# Patient Record
Sex: Male | Born: 1945 | Race: White | Hispanic: No | Marital: Married | Smoking: Never smoker
Health system: Southern US, Community
[De-identification: ages and names within clinical notes are randomized; demographics above are authoritative.]

## PROBLEM LIST (undated history)

## (undated) DIAGNOSIS — I10 Essential (primary) hypertension: Secondary | ICD-10-CM

## (undated) DIAGNOSIS — I420 Dilated cardiomyopathy: Secondary | ICD-10-CM

## (undated) HISTORY — DX: Essential (primary) hypertension: I10

## (undated) HISTORY — DX: Dilated cardiomyopathy: I42.0

---

## 2007-11-17 ENCOUNTER — Ambulatory Visit: Payer: Self-pay | Admitting: Family Medicine

## 2007-12-10 ENCOUNTER — Ambulatory Visit: Payer: Self-pay | Admitting: Otolaryngology

## 2009-10-19 ENCOUNTER — Emergency Department: Payer: Self-pay | Admitting: Emergency Medicine

## 2009-11-21 ENCOUNTER — Encounter: Payer: Self-pay | Admitting: Cardiovascular Disease

## 2009-11-21 DIAGNOSIS — R0609 Other forms of dyspnea: Secondary | ICD-10-CM | POA: Insufficient documentation

## 2009-11-21 DIAGNOSIS — R0989 Other specified symptoms and signs involving the circulatory and respiratory systems: Secondary | ICD-10-CM

## 2009-11-21 DIAGNOSIS — I10 Essential (primary) hypertension: Secondary | ICD-10-CM

## 2009-11-28 ENCOUNTER — Encounter: Payer: Self-pay | Admitting: Cardiology

## 2009-11-28 ENCOUNTER — Ambulatory Visit: Payer: Self-pay

## 2010-04-05 ENCOUNTER — Ambulatory Visit: Payer: Self-pay | Admitting: Otolaryngology

## 2010-04-09 LAB — PATHOLOGY REPORT

## 2010-09-18 NOTE — Miscellaneous (Signed)
Summary: Orders Update/Dr Ronna Polio ordered  Clinical Lists Changes  Orders: Added new Referral order of Echocardiogram (Echo) - Signed

## 2011-03-05 ENCOUNTER — Encounter: Payer: Self-pay | Admitting: Cardiovascular Disease

## 2011-05-09 ENCOUNTER — Other Ambulatory Visit: Payer: Self-pay | Admitting: Internal Medicine

## 2012-02-13 ENCOUNTER — Emergency Department: Payer: Self-pay | Admitting: Emergency Medicine

## 2012-02-13 LAB — URINALYSIS, COMPLETE
Bacteria: NONE SEEN
Nitrite: NEGATIVE
Ph: 6 (ref 4.5–8.0)
Protein: 100
Specific Gravity: 1.024 (ref 1.003–1.030)
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
## 2018-02-04 DIAGNOSIS — M1990 Unspecified osteoarthritis, unspecified site: Secondary | ICD-10-CM | POA: Insufficient documentation

## 2018-02-04 DIAGNOSIS — E785 Hyperlipidemia, unspecified: Secondary | ICD-10-CM | POA: Insufficient documentation

## 2018-02-23 DIAGNOSIS — D649 Anemia, unspecified: Secondary | ICD-10-CM | POA: Insufficient documentation

## 2018-03-25 ENCOUNTER — Other Ambulatory Visit: Payer: Self-pay | Admitting: General Surgery

## 2018-03-25 DIAGNOSIS — K4091 Unilateral inguinal hernia, without obstruction or gangrene, recurrent: Secondary | ICD-10-CM

## 2018-04-08 ENCOUNTER — Ambulatory Visit
Admission: RE | Admit: 2018-04-08 | Discharge: 2018-04-08 | Disposition: A | Discharge status: Home / Self Care | Payer: Medicare HMO | Source: Ambulatory Visit | Attending: General Surgery | Admitting: General Surgery

## 2018-04-08 DIAGNOSIS — K4091 Unilateral inguinal hernia, without obstruction or gangrene, recurrent: Secondary | ICD-10-CM

## 2018-04-08 MED ORDER — IOPAMIDOL (ISOVUE-300) INJECTION 61%
100.0000 mL | Freq: Once | INTRAVENOUS | Status: AC | PRN
  Administered 2018-04-08: 100 mL via INTRAVENOUS

## 2018-08-03 DIAGNOSIS — G4733 Obstructive sleep apnea (adult) (pediatric): Secondary | ICD-10-CM | POA: Insufficient documentation

## 2019-03-22 ENCOUNTER — Other Ambulatory Visit: Payer: Self-pay | Admitting: Gastroenterology

## 2019-03-22 DIAGNOSIS — D509 Iron deficiency anemia, unspecified: Secondary | ICD-10-CM

## 2019-03-22 DIAGNOSIS — R195 Other fecal abnormalities: Secondary | ICD-10-CM

## 2019-04-05 ENCOUNTER — Ambulatory Visit
Admission: RE | Admit: 2019-04-05 | Discharge: 2019-04-05 | Disposition: A | Discharge status: Home / Self Care | Payer: Medicare HMO | Source: Ambulatory Visit | Attending: Gastroenterology | Admitting: Gastroenterology

## 2019-04-05 DIAGNOSIS — D509 Iron deficiency anemia, unspecified: Secondary | ICD-10-CM

## 2019-04-05 DIAGNOSIS — R195 Other fecal abnormalities: Secondary | ICD-10-CM

## 2019-10-01 ENCOUNTER — Ambulatory Visit: Payer: Medicare HMO | Attending: Internal Medicine

## 2019-10-01 DIAGNOSIS — Z23 Encounter for immunization: Secondary | ICD-10-CM | POA: Insufficient documentation

## 2019-10-01 NOTE — Progress Notes (Signed)
   Covid-19 Vaccination Clinic  Name:  [name]    MRN: [id number] DOB: 1946/07/07  10/01/2019  [name] was observed post Covid-19 immunization for 15 minutes without incidence. He was provided with Vaccine Information Sheet and instruction to access the V-Safe system.   [name] was instructed to call 911 with any severe reactions post vaccine: [name] Difficulty breathing  . Swelling of your face and throat  . A fast heartbeat  . A bad rash all over your body  . Dizziness and weakness    Immunizations Administered    Name Date Dose VIS Date Route   Pfizer COVID-19 Vaccine 10/01/2019 11:21 AM 0.3 mL 07/30/2019 Intramuscular   Manufacturer: [place]   Lot: [id number]   [place]: [id number]

## 2019-10-27 ENCOUNTER — Ambulatory Visit: Payer: Medicare HMO | Attending: Internal Medicine

## 2019-10-27 DIAGNOSIS — Z23 Encounter for immunization: Secondary | ICD-10-CM | POA: Insufficient documentation

## 2019-10-27 NOTE — Progress Notes (Signed)
   Covid-19 Vaccination Clinic  Name:  [name]    MRN: [id number] DOB: Jul 19, 1946  10/27/2019  [name] was observed post Covid-19 immunization for 15 minutes without incident. He was provided with Vaccine Information Sheet and instruction to access the V-Safe system.   [name] was instructed to call 911 with any severe reactions post vaccine: [name] Difficulty breathing  . Swelling of face and throat  . A fast heartbeat  . A bad rash all over body  . Dizziness and weakness   Immunizations Administered    Name Date Dose VIS Date Route   Pfizer COVID-19 Vaccine 10/27/2019 11:50 AM 0.3 mL 07/30/2019 Intramuscular   Manufacturer: [place]   Lot: [id number]   [place]: [id number]

## 2019-12-09 ENCOUNTER — Inpatient Hospital Stay: Payer: Medicare HMO

## 2019-12-09 ENCOUNTER — Encounter: Payer: Self-pay | Admitting: Oncology

## 2019-12-09 ENCOUNTER — Other Ambulatory Visit: Payer: Self-pay

## 2019-12-09 ENCOUNTER — Inpatient Hospital Stay: Payer: Medicare HMO | Attending: Oncology | Admitting: Oncology

## 2019-12-09 VITALS — BP 189/101 | HR 63 | Temp 96.9°F | Resp 20 | Wt 274.5 lb

## 2019-12-09 DIAGNOSIS — Z7984 Long term (current) use of oral hypoglycemic drugs: Secondary | ICD-10-CM | POA: Diagnosis not present

## 2019-12-09 DIAGNOSIS — D649 Anemia, unspecified: Secondary | ICD-10-CM | POA: Diagnosis not present

## 2019-12-09 DIAGNOSIS — I10 Essential (primary) hypertension: Secondary | ICD-10-CM | POA: Diagnosis not present

## 2019-12-09 DIAGNOSIS — I420 Dilated cardiomyopathy: Secondary | ICD-10-CM | POA: Diagnosis not present

## 2019-12-09 DIAGNOSIS — Z79899 Other long term (current) drug therapy: Secondary | ICD-10-CM | POA: Diagnosis not present

## 2019-12-09 LAB — IRON AND TIBC
Iron: 39 ug/dL — ABNORMAL LOW (ref 45–182)
Saturation Ratios: 14 % — ABNORMAL LOW (ref 17.9–39.5)
TIBC: 281 ug/dL (ref 250–450)
UIBC: 242 ug/dL

## 2019-12-09 LAB — RETICULOCYTES
Immature Retic Fract: 9.7 % (ref 2.3–15.9)
RBC.: 3.98 MIL/uL — ABNORMAL LOW (ref 4.22–5.81)
Retic Count, Absolute: 101.5 10*3/uL (ref 19.0–186.0)
Retic Ct Pct: 2.6 % (ref 0.4–3.1)

## 2019-12-09 LAB — CBC
HCT: 36.1 % — ABNORMAL LOW (ref 39.0–52.0)
Hemoglobin: 12.1 g/dL — ABNORMAL LOW (ref 13.0–17.0)
MCH: 30.6 pg (ref 26.0–34.0)
MCHC: 33.5 g/dL (ref 30.0–36.0)
MCV: 91.2 fL (ref 80.0–100.0)
Platelets: 188 10*3/uL (ref 150–400)
RBC: 3.96 MIL/uL — ABNORMAL LOW (ref 4.22–5.81)
RDW: 13.4 % (ref 11.5–15.5)
WBC: 6.8 10*3/uL (ref 4.0–10.5)
nRBC: 0 % (ref 0.0–0.2)

## 2019-12-09 LAB — FOLATE: Folate: 17.7 ng/mL (ref >5.9)

## 2019-12-09 LAB — VITAMIN B12: Vitamin B-12: 669 pg/mL (ref 180–914)

## 2019-12-09 LAB — DAT, POLYSPECIFIC AHG (ARMC ONLY): Polyspecific AHG test: NEGATIVE

## 2019-12-09 LAB — FERRITIN: Ferritin: 203 ng/mL (ref 24–336)

## 2019-12-09 LAB — LACTATE DEHYDROGENASE: LDH: 127 U/L (ref 98–192)
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
## 2019-12-10 LAB — HAPTOGLOBIN: Haptoglobin: 143 mg/dL (ref 34–355)

## 2019-12-10 LAB — ERYTHROPOIETIN: Erythropoietin: 18.1 m[IU]/mL (ref 2.6–18.5)

## 2019-12-10 NOTE — Progress Notes (Signed)
[place]  Telephone:(336) [phone] Fax:(336) [phone]  ID: [name] OB: 1946-03-06  MR#: [id number]  [id number]  Patient Care Team: Perrin Maltese, MD as PCP - General (Internal Medicine)  CHIEF COMPLAINT: Anemia, unspecified.  INTERVAL HISTORY: Patient is a 74 year old male who was noted to have a mild yet persistent anemia despite oral iron supplementation.  He currently feels well and is asymptomatic.  He does not complain of any weakness or fatigue.  He has good appetite and denies weight loss.  He has no neurologic complaints.  He denies any recent fevers or illnesses.  He has no chest pain, shortness of breath, cough, or hemoptysis.  He denies any nausea, vomiting, constipation, or diarrhea.  He has no melena or hematochezia.  He has no urinary complaints.  Patient feels at his baseline and offers no specific complaints.  REVIEW OF SYSTEMS:   Review of Systems  Constitutional: Negative.  Negative for fever, malaise/fatigue and weight loss.  Respiratory: Negative.  Negative for cough, hemoptysis and shortness of breath.   Cardiovascular: Negative.  Negative for chest pain and leg swelling.  Gastrointestinal: Negative.  Negative for abdominal pain, blood in stool and melena.  Genitourinary: Negative.  Negative for hematuria.  Musculoskeletal: Negative.  Negative for back pain.  Skin: Negative.  Negative for rash.  Neurological: Negative.  Negative for dizziness, focal weakness, weakness and headaches.  Psychiatric/Behavioral: Negative.  The patient is not nervous/anxious.     As per HPI. Otherwise, a complete review of systems is negative.  PAST MEDICAL HISTORY: Past Medical History:  Diagnosis Date  . Congestive cardiomyopathy ([place])   . Hypertension     PAST SURGICAL HISTORY: History reviewed. No pertinent surgical history.  FAMILY HISTORY: History reviewed. No pertinent family history.  ADVANCED DIRECTIVES (Y/N):  N  HEALTH  MAINTENANCE: Social History   Tobacco Use  . Smoking status: Never Smoker  . Smokeless tobacco: Never Used  . Tobacco comment: Tobacco use-no  Substance Use Topics  . Alcohol use: Yes    Comment: Occasional  . Drug use: Not on file     Colonoscopy:  PAP:  Bone density:  Lipid panel:  No Known Allergies  Current Outpatient Medications  Medication Sig Dispense Refill  . amLODipine (NORVASC) 10 MG tablet Take 10 mg by mouth daily.      [name] aspirin 81 MG EC tablet Take 81 mg by mouth daily.      [name] atorvastatin (LIPITOR) 10 MG tablet Take 10 mg by mouth daily.    . cloNIDine (CATAPRES) 0.3 MG tablet Take 0.3 mg by mouth 2 (two) times daily.    . ferrous sulfate 325 (65 FE) MG tablet Take 975 mg by mouth daily with breakfast.     . hydrALAZINE (APRESOLINE) 100 MG tablet Take 100 mg by mouth 3 (three) times daily.    [name] losartan-hydrochlorothiazide (HYZAAR) 100-25 MG tablet Take 1 tablet by mouth daily.    . metFORMIN (GLUCOPHAGE) 500 MG tablet Take 500 mg by mouth 2 (two) times daily with a meal.    . metoprolol tartrate (LOPRESSOR) 100 MG tablet Take 100 mg by mouth daily.    . potassium chloride SA (KLOR-CON) 20 MEQ tablet Take 20 mEq by mouth daily.     No current facility-administered medications for this visit.    OBJECTIVE: Vitals:   12/09/19 1523  BP: (!) 189/101  Pulse: 63  Resp: 20  Temp: (!) 96.9 F (36.1 C)  SpO2: 98%  There is no height or weight on file to calculate BMI.    ECOG FS:0 - Asymptomatic  General: Well-developed, well-nourished, no acute distress. Eyes: Pink conjunctiva, anicteric sclera. HEENT: Normocephalic, moist mucous membranes. Lungs: No audible wheezing or coughing. Heart: Regular rate and rhythm. Abdomen: Soft, nontender, no obvious distention. Musculoskeletal: No edema, cyanosis, or clubbing. Neuro: Alert, answering all questions appropriately. Cranial nerves grossly intact. Skin: No rashes or petechiae noted. Psych: Normal  affect. Lymphatics: No cervical, calvicular, axillary or inguinal LAD.   LAB RESULTS:  No results found for: NA, K, CL, CO2, GLUCOSE, BUN, CREATININE, CALCIUM, PROT, ALBUMIN, AST, ALT, ALKPHOS, BILITOT, GFRNONAA, GFRAA  Lab Results  Component Value Date   WBC 6.8 12/09/2019   HGB 12.1 (L) 12/09/2019   HCT 36.1 (L) 12/09/2019   MCV 91.2 12/09/2019   PLT 188 12/09/2019   Lab Results  Component Value Date   IRON 39 (L) 12/09/2019   TIBC 281 12/09/2019   IRONPCTSAT 14 (L) 12/09/2019   Lab Results  Component Value Date   FERRITIN 203 12/09/2019      STUDIES: No results found.  ASSESSMENT: Anemia, unspecified.  PLAN:    1.  Anemia, unspecified: Patient's hemoglobin is only mildly decreased at 12.1 which is essentially unchanged from previous.  Despite oral iron supplementation, he has a mildly decreased total iron as well as saturation ratio.  B12, folate, and hemolysis labs are either negative or within normal limits.  Patient had luminal evaluation in August 2020 that did not reveal any significant pathology.  The remainder of his laboratory work from today is pending at time of dictation.  Patient will have video assisted telemedicine visit in 3 weeks to discuss his results.  He may benefit from 1 dose of IV iron in the near future. 2.  Hypertension: Significantly elevated today.  Patient reports he has "whitecoat hypertension".  Continue monitoring and treatment per primary care.  I spent a total of 45 minutes reviewing chart data, face-to-face evaluation with the patient, counseling and coordination of care as detailed above.   Patient expressed understanding and was in agreement with this plan. He also understands that He can call clinic at any time with any questions, concerns, or complaints.    Lloyd Huger, MD   12/10/2019 9:45 AM

## 2019-12-11 LAB — PROTEIN ELECTROPHORESIS, SERUM
A/G Ratio: 1.5 (ref 0.7–1.7)
Albumin ELP: 3.7 g/dL (ref 2.9–4.4)
Alpha-1-Globulin: 0.2 g/dL (ref 0.0–0.4)
Alpha-2-Globulin: 0.6 g/dL (ref 0.4–1.0)
Beta Globulin: 0.8 g/dL (ref 0.7–1.3)
Gamma Globulin: 0.9 g/dL (ref 0.4–1.8)
Globulin, Total: 2.5 g/dL (ref 2.2–3.9)
Total Protein ELP: 6.2 g/dL (ref 6.0–8.5)

## 2019-12-24 NOTE — Progress Notes (Signed)
[facility]  [phone]) [phone] [phone]) [phone]  ID: [name] OB: [birth year]  MR#: [id number]  [id number]  Patient Care Team: Perrin Maltese, MD as PCP - General (Internal Medicine) Lloyd Huger, MD as Consulting Physician (Oncology)  I connected with [name] on 12/31/19 at  2:15 PM EDT by video enabled telemedicine visit and verified that I am speaking with the correct person using two identifiers.   I discussed the limitations, risks, security and privacy concerns of performing an evaluation and management service by telemedicine and the availability of in-person appointments. I also discussed with the patient that there may be a patient responsible charge related to this service. The patient expressed understanding and agreed to proceed.   Other persons participating in the visit and their role in the encounter: Patient, MD.  Patient's location: Home. Provider's location: Clinic.  CHIEF COMPLAINT: Mild iron deficiency anemia.  INTERVAL HISTORY: Patient agreed to video assisted telemedicine visit for further evaluation and discussion of his laboratory results.  He continues to feel well and remains asymptomatic. He does not complain of any weakness or fatigue.  He has a good appetite and denies weight loss.  He has no neurologic complaints.  He denies any recent fevers or illnesses.  He has no chest pain, shortness of breath, cough, or hemoptysis.  He denies any nausea, vomiting, constipation, or diarrhea.  He has no melena or hematochezia.  He has no urinary complaints.  Patient offers no specific complaints today.  REVIEW OF SYSTEMS:   Review of Systems  Constitutional: Negative.  Negative for fever, malaise/fatigue and weight loss.  Respiratory: Negative.  Negative for cough, hemoptysis and shortness of breath.   Cardiovascular: Negative.  Negative for chest pain and leg swelling.  Gastrointestinal: Negative.  Negative for abdominal  pain, blood in stool and melena.  Genitourinary: Negative.  Negative for hematuria.  Musculoskeletal: Negative.  Negative for back pain.  Skin: Negative.  Negative for rash.  Neurological: Negative.  Negative for dizziness, focal weakness, weakness and headaches.  Psychiatric/Behavioral: Negative.  The patient is not nervous/anxious.     As per HPI. Otherwise, a complete review of systems is negative.  PAST MEDICAL HISTORY: Past Medical History:  Diagnosis Date  . Congestive cardiomyopathy ([place])   . Hypertension     PAST SURGICAL HISTORY: History reviewed. No pertinent surgical history.  FAMILY HISTORY: History reviewed. No pertinent family history.  ADVANCED DIRECTIVES (Y/N):  N  HEALTH MAINTENANCE: Social History   Tobacco Use  . Smoking status: Never Smoker  . Smokeless tobacco: Never Used  . Tobacco comment: Tobacco use-no  Substance Use Topics  . Alcohol use: Yes    Comment: Occasional  . Drug use: Not on file     Colonoscopy:  PAP:  Bone density:  Lipid panel:  No Known Allergies  Current Outpatient Medications  Medication Sig Dispense Refill  . amLODipine (NORVASC) 10 MG tablet Take 10 mg by mouth daily.      [name] aspirin 81 MG EC tablet Take 81 mg by mouth daily.      [name] atorvastatin (LIPITOR) 10 MG tablet Take 10 mg by mouth daily.    . cloNIDine (CATAPRES) 0.3 MG tablet Take 0.3 mg by mouth 2 (two) times daily.    . ferrous sulfate 325 (65 FE) MG tablet Take 975 mg by mouth daily with breakfast.     . hydrALAZINE (APRESOLINE) 100 MG tablet Take 100 mg by mouth 3 (three) times daily.    [name]  losartan-hydrochlorothiazide (HYZAAR) 100-25 MG tablet Take 1 tablet by mouth daily.    . metFORMIN (GLUCOPHAGE) 500 MG tablet Take 500 mg by mouth 2 (two) times daily with a meal.    . metoprolol tartrate (LOPRESSOR) 100 MG tablet Take 100 mg by mouth daily.    . potassium chloride SA (KLOR-CON) 20 MEQ tablet Take 20 mEq by mouth daily.    . Vitamin D, Ergocalciferol,  (DRISDOL) 1.25 MG (50000 UNIT) CAPS capsule Take 1 capsule by mouth every 7 (seven) days.      No current facility-administered medications for this visit.    OBJECTIVE: There were no vitals filed for this visit.   There is no height or weight on file to calculate BMI.    ECOG FS:0 - Asymptomatic  General: Well-developed, well-nourished, no acute distress. HEENT: Normocephalic. Neuro: Alert, answering all questions appropriately. Cranial nerves grossly intact. Psych: Normal affect.   LAB RESULTS:  No results found for: NA, K, CL, CO2, GLUCOSE, BUN, CREATININE, CALCIUM, PROT, ALBUMIN, AST, ALT, ALKPHOS, BILITOT, GFRNONAA, GFRAA  Lab Results  Component Value Date   WBC 6.8 12/09/2019   HGB 12.1 (L) 12/09/2019   HCT 36.1 (L) 12/09/2019   MCV 91.2 12/09/2019   PLT 188 12/09/2019   Lab Results  Component Value Date   IRON 39 (L) 12/09/2019   TIBC 281 12/09/2019   IRONPCTSAT 14 (L) 12/09/2019   Lab Results  Component Value Date   FERRITIN 203 12/09/2019      STUDIES: No results found.  ASSESSMENT: Mild iron deficiency anemia.    PLAN:    1.  A mild iron deficiency anemia: Patient's hemoglobin is only mildly decreased at 12.1 which is essentially unchanged from previous.  His iron stores are also mildly decreased.  All of his other laboratory work including is either negative or within normal limits.  Patient had luminal evaluation in August 2020 that did not reveal any significant pathology.  No intervention is needed at this time.  Patient has been instructed to continue oral iron supplementation as prescribed.  No further follow-up is necessary.  Please refer patient back if there are any questions or concerns. 2.  Hypertension: S chronic and unchanged.  Continue monitoring and treatment per primary care.  I provided 20 minutes of face-to-face video visit time during this encounter which included chart review, counseling, and coordination of care as documented  above.   Patient expressed understanding and was in agreement with this plan. He also understands that He can call clinic at any time with any questions, concerns, or complaints.    Lloyd Huger, MD   12/31/2019 7:04 PM

## 2019-12-31 ENCOUNTER — Encounter: Payer: Self-pay | Admitting: Oncology

## 2019-12-31 ENCOUNTER — Inpatient Hospital Stay: Payer: Medicare HMO | Attending: Oncology | Admitting: Oncology

## 2019-12-31 DIAGNOSIS — D649 Anemia, unspecified: Secondary | ICD-10-CM | POA: Diagnosis not present

## 2019-12-31 NOTE — Progress Notes (Signed)
Patient reported no pain or concerns. Just states he is feeling a little tired.

## 2020-01-28 ENCOUNTER — Encounter: Payer: Medicare HMO | Attending: Physician Assistant | Admitting: Physician Assistant

## 2020-01-28 ENCOUNTER — Other Ambulatory Visit: Payer: Self-pay

## 2020-01-28 DIAGNOSIS — T25722A Corrosion of third degree of left foot, initial encounter: Secondary | ICD-10-CM | POA: Diagnosis not present

## 2020-01-28 DIAGNOSIS — T6591XA Toxic effect of unspecified substance, accidental (unintentional), initial encounter: Secondary | ICD-10-CM | POA: Diagnosis not present

## 2020-01-28 DIAGNOSIS — X58XXXA Exposure to other specified factors, initial encounter: Secondary | ICD-10-CM | POA: Insufficient documentation

## 2020-01-28 DIAGNOSIS — I10 Essential (primary) hypertension: Secondary | ICD-10-CM | POA: Diagnosis not present

## 2020-01-28 DIAGNOSIS — Z833 Family history of diabetes mellitus: Secondary | ICD-10-CM | POA: Diagnosis not present

## 2020-01-28 DIAGNOSIS — E119 Type 2 diabetes mellitus without complications: Secondary | ICD-10-CM | POA: Diagnosis not present

## 2020-01-28 DIAGNOSIS — Z8249 Family history of ischemic heart disease and other diseases of the circulatory system: Secondary | ICD-10-CM | POA: Insufficient documentation

## 2020-01-28 NOTE — Progress Notes (Signed)
LESSIE, MANIGO ([id number]) Visit Report for 01/28/2020 Abuse/Suicide Risk Screen Details Patient Name: Muecke, Jakin A. Date of Service: 01/28/2020 1:00 PM Medical Record Number: [id number] Patient Account Number: [id number] Date of Birth/Sex: 08/04/1946 (74 y.o. M) Treating RN: Army Melia Primary Care Jalaysha Skilton: Lamonte Sakai Other Clinician: Referring Leland Staszewski: Evern Bio Treating Juanetta Negash/Extender: Melburn Hake, HOYT Weeks in Treatment: 0 Abuse/Suicide Risk Screen Items Answer ABUSE RISK SCREEN: Has anyone close to you tried to hurt or harm you recentlyo No Do you feel uncomfortable with anyone in your familyo No Has anyone forced you do things that you didnot want to doo No Electronic Signature(s) Signed: 01/28/2020 3:19:07 PM By: Army Melia Entered By: Army Melia on 01/28/2020 13:35:53 Salas, Gerron A. ([id number]) -------------------------------------------------------------------------------- Activities of Daily Living Details Patient Name: Ozawa, Jrue A. Date of Service: 01/28/2020 1:00 PM Medical Record Number: [id number] Patient Account Number: [id number] Date of Birth/Sex: 1946-07-08 (74 y.o. M) Treating RN: Army Melia Primary Care Wretha Laris: Lamonte Sakai Other Clinician: Referring Jonaven Hilgers: Evern Bio Treating Vinal Rosengrant/Extender: Melburn Hake, HOYT Weeks in Treatment: 0 Activities of Daily Living Items Answer Activities of Daily Living (Please select one for each item) Drive Automobile Completely Able Take Medications Completely Able Use Telephone Completely Able Care for Appearance Completely Able Use Toilet Completely Able Bath / Shower Completely Able Dress Self Completely Able Feed Self Completely Able Walk Completely Able Get In / Out Bed Completely Able Housework Completely Able Prepare Meals Completely [place] for Self Completely Able Electronic Signature(s) Signed: 01/28/2020 3:19:07 PM By: Army Melia Entered By:  Army Melia on 01/28/2020 13:36:02 Benbow, Jayceon A. ([id number]) -------------------------------------------------------------------------------- Education Screening Details Patient Name: [name] Date of Service: 01/28/2020 1:00 PM Medical Record Number: [id number] Patient Account Number: [id number] Date of Birth/Sex: [birth year] (74 y.o. M) Treating RN: Army Melia Primary Care Ikhlas Albo: Lamonte Sakai Other Clinician: Referring Martine Bleecker: Evern Bio Treating Ihsan Nomura/Extender: Melburn Hake, HOYT Weeks in Treatment: 0 Primary Learner Assessed: Patient Learning Preferences/Education Level/Primary Language Learning Preference: Explanation Highest Education Level: College or Above Preferred Language: English Cognitive Barrier Language Barrier: No Translator Needed: No Memory Deficit: No Emotional Barrier: No Cultural/Religious Beliefs Affecting Medical Care: No Physical Barrier Impaired Vision: No Impaired Hearing: No Decreased Hand dexterity: No Knowledge/Comprehension Knowledge Level: High Comprehension Level: High Ability to understand written instructions: High Ability to understand verbal instructions: High Motivation Anxiety Level: Calm Cooperation: Cooperative Education Importance: Acknowledges Need Interest in Health Problems: Asks Questions Perception: Coherent Willingness to Engage in Self-Management High Activities: Readiness to Engage in Self-Management High Activities: Electronic Signature(s) Signed: 01/28/2020 3:19:07 PM By: Army Melia Entered By: Army Melia on 01/28/2020 13:36:19 Loera, Kendric A. ([id number]) -------------------------------------------------------------------------------- Fall Risk Assessment Details Patient Name: [name] Date of Service: 01/28/2020 1:00 PM Medical Record Number: [id number] Patient Account Number: [id number] Date of Birth/Sex: 10/08/1945 (74 y.o. M) Treating RN: Army Melia Primary Care Daemian Gahm: Lamonte Sakai Other Clinician: Referring Yves Fodor: Evern Bio Treating Whyatt Klinger/Extender: Melburn Hake, HOYT Weeks in Treatment: 0 Fall Risk Assessment Items Have you had 2 or more falls in the last 12 monthso 0 No Have you had any fall that resulted in injury in the last 12 monthso 0 No FALLS RISK SCREEN History of falling - immediate or within 3 months 0 No Secondary diagnosis (Do you have 2 or more medical diagnoseso) 0 No Ambulatory aid None/bed rest/wheelchair/nurse 0 No Crutches/cane/walker 0 No Furniture 0 No Intravenous therapy Access/Saline/Heparin Lock 0 No Gait/Transferring Normal/ bed rest/ wheelchair 0 No Weak (short steps  with or without shuffle, stooped but able to lift head while walking, may 0 No seek support from furniture) Impaired (short steps with shuffle, may have difficulty arising from chair, head down, impaired 0 No balance) Mental Status Oriented to own ability 0 No Electronic Signature(s) Signed: 01/28/2020 3:19:07 PM By: Army Melia Entered By: Army Melia on 01/28/2020 13:36:24 Schnebly, Karston A. ([id number]) -------------------------------------------------------------------------------- Foot Assessment Details Patient Name: [name], Cobain A. Date of Service: 01/28/2020 1:00 PM Medical Record Number: [id number] Patient Account Number: [id number] Date of Birth/Sex: 1946/01/09 (74 y.o. M) Treating RN: Army Melia Primary Care Layli Capshaw: Lamonte Sakai Other Clinician: Referring Jnai Snellgrove: Evern Bio Treating Jedrick Hutcherson/Extender: Melburn Hake, HOYT Weeks in Treatment: 0 Foot Assessment Items Site Locations + = Sensation present, - = Sensation absent, C = Callus, U = Ulcer R = Redness, W = Warmth, M = Maceration, PU = Pre-ulcerative lesion F = Fissure, S = Swelling, D = Dryness Assessment Right: Left: Other Deformity: No No Prior Foot Ulcer: No No Prior Amputation: No No Charcot Joint: No No Ambulatory Status: Ambulatory Without Help Gait:  Steady Electronic Signature(s) Signed: 01/28/2020 3:19:07 PM By: Army Melia Entered By: Army Melia on 01/28/2020 13:38:43 Th[place]ton, Lavern A. ([id number]) -------------------------------------------------------------------------------- Nutrition Risk Screening Details Patient Name: Hendley, Qasim A. Date of Service: 01/28/2020 1:00 PM Medical Record Number: [id number] Patient Account Number: [id number] Date of Birth/Sex: 26-May-1946 (74 y.o. M) Treating RN: Army Melia Primary Care Heydy Montilla: Lamonte Sakai Other Clinician: Referring Azyiah Bo: Evern Bio Treating Henok Heacock/Extender: Melburn Hake, HOYT Weeks in Treatment: 0 Height (in): 70 Weight (lbs): 254 Body Mass Index (BMI): 36.4 Nutrition Risk Screening Items Score Screening NUTRITION RISK SCREEN: I have an illness or condition that made me change the kind and/or amount of food I eat 0 No I eat fewer than two meals per day 0 No I eat few fruits and vegetables, or milk products 0 No I have three or more drinks of beer, liquor or wine almost every day 0 No I have tooth or mouth problems that make it hard for me to eat 0 No I don't always have enough money to buy the food I need 0 No I eat alone most of the time 0 No I take three or more different prescribed or over-the-counter drugs a day 0 No Without wanting to, I have lost or gained 10 pounds in the last six months 0 No I am not always physically able to shop, cook and/or feed myself 0 No Nutrition Protocols Good Risk Protocol 0 No interventions needed Moderate Risk Protocol High Risk Proctocol Risk Level: Good Risk Score: 0 Electronic Signature(s) Signed: 01/28/2020 3:19:07 PM By: Army Melia Entered By: Army Melia on 01/28/2020 13:36:32
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
## 2020-01-28 NOTE — Progress Notes (Signed)
QUADRY, KAMPA ([id number]) Visit Report for 01/28/2020 Allergy List Details Patient Name: [name], [name] Date of Service: 01/28/2020 1:00 PM Medical Record Number: [id number] Patient Account Number: [id number] Date of Birth/Sex: [birth year] (74 y.o. M) Treating RN: Army Melia Primary Care Boubacar Lerette: Lamonte Sakai Other Clinician: Referring Haylea Schlichting: Evern Bio Treating Shantea Poulton/Extender: STONE III, HOYT Weeks in Treatment: 0 Allergies Active Allergies No Known Allergies Allergy Notes Electronic Signature(s) Signed: 01/28/2020 3:19:07 PM By: Army Melia Entered By: Army Melia on 01/28/2020 13:33:01 Levert, Avant A. ([id number]) -------------------------------------------------------------------------------- Arrival Information Details Patient Name: Jay Monaco A. Date of Service: 01/28/2020 1:00 PM Medical Record Number: [id number] Patient Account Number: [id number] Date of Birth/Sex: [birth year] (74 y.o. M) Treating RN: Montey Hora Primary Care Latanja Lehenbauer: Lamonte Sakai Other Clinician: Referring Emalea Mix: Evern Bio Treating Aloura Matsuoka/Extender: Melburn Hake, HOYT Weeks in Treatment: 0 Visit Information Patient Arrived: Ambulatory Arrival Time: 13:25 Accompanied By: self Transfer Assistance: None Patient Identification Verified: Yes Secondary Verification Process Completed: Yes Patient Has Alerts: Yes Patient Alerts: Patient on Blood Thinner DMII aspirin 81 Electronic Signature(s) Signed: 01/28/2020 3:58:18 PM By: Montey Hora Entered By: Montey Hora on 01/28/2020 13:49:06 Lyter, Xadrian A. ([id number]) -------------------------------------------------------------------------------- Clinic Level of Care Assessment Details Patient Name: [name], [name] Date of Service: 01/28/2020 1:00 PM Medical Record Number: [id number] Patient Account Number: [id number] Date of Birth/Sex: [birth year] (74 y.o. M) Treating RN: Montey Hora Primary Care Quade Ramirez: Lamonte Sakai Other  Clinician: Referring Kelbie Moro: Evern Bio Treating Mysty Kielty/Extender: Melburn Hake, HOYT Weeks in Treatment: 0 Clinic Level of Care Assessment Items TOOL 2 Quantity Score []  - Use when only an EandM is performed on the INITIAL visit 0 ASSESSMENTS - Nursing Assessment / Reassessment X - General Physical Exam (combine w/ comprehensive assessment (listed just below) when performed on new 1 20 pt. evals) X- 1 25 Comprehensive Assessment (HX, ROS, Risk Assessments, Wounds Hx, etc.) ASSESSMENTS - Wound and Skin Assessment / Reassessment X - Simple Wound Assessment / Reassessment - one wound 1 5 []  - 0 Complex Wound Assessment / Reassessment - multiple wounds []  - 0 Dermatologic / Skin Assessment (not related to wound area) ASSESSMENTS - Ostomy and/or Continence Assessment and Care []  - Incontinence Assessment and Management 0 []  - 0 Ostomy Care Assessment and Management (repouching, etc.) PROCESS - Coordination of Care X - Simple Patient / Family Education for ongoing care 1 15 []  - 0 Complex (extensive) Patient / Family Education for ongoing care X- 1 10 Staff obtains Programmer, systems, Records, Test Results / Process Orders []  - 0 Staff telephones HHA, Nursing Homes / Clarify orders / etc []  - 0 Routine Transfer to another Facility (non-emergent condition) []  - 0 Routine Hospital Admission (non-emergent condition) X- 1 15 New Admissions / Biomedical engineer / Ordering NPWT, Apligraf, etc. []  - 0 Emergency Hospital Admission (emergent condition) X- 1 10 Simple Discharge Coordination []  - 0 Complex (extensive) Discharge Coordination PROCESS - Special Needs []  - Pediatric / Minor Patient Management 0 []  - 0 Isolation Patient Management []  - 0 Hearing / Language / Visual special needs []  - 0 Assessment of Community assistance (transportation, D/C planning, etc.) []  - 0 Additional assistance / Altered mentation []  - 0 Support Surface(s) Assessment (bed, cushion, seat,  etc.) INTERVENTIONS - Wound Cleansing / Measurement X - Wound Imaging (photographs - any number of wounds) 1 5 []  - 0 Wound Tracing (instead of photographs) X- 1 5 Simple Wound Measurement - one wound []  - 0 Complex Wound Measurement - multiple wounds Mickel, Anay A. ([id number]) X-  1 5 Simple Wound Cleansing - one wound []  - 0 Complex Wound Cleansing - multiple wounds INTERVENTIONS - Wound Dressings X - Small Wound Dressing one or multiple wounds 1 10 []  - 0 Medium Wound Dressing one or multiple wounds []  - 0 Large Wound Dressing one or multiple wounds []  - 0 Application of Medications - injection INTERVENTIONS - Miscellaneous []  - External ear exam 0 []  - 0 Specimen Collection (cultures, biopsies, blood, body fluids, etc.) []  - 0 Specimen(s) / Culture(s) sent or taken to Lab for analysis []  - 0 Patient Transfer (multiple staff / Civil Service fast streamer / Similar devices) []  - 0 Simple Staple / Suture removal (25 or less) []  - 0 Complex Staple / Suture removal (26 or more) []  - 0 Hypo / Hyperglycemic Management (close monitor of Blood Glucose) X- 1 15 Ankle / Brachial Index (ABI) - do not check if billed separately Has the patient been seen at the hospital within the last three years: Yes Total Score: 140 Level Of Care: New/Established - Level 4 Electronic Signature(s) Signed: 01/28/2020 3:58:18 PM By: Montey Hora Entered By: Montey Hora on 01/28/2020 13:57:58 [name], [name] ([id number]) -------------------------------------------------------------------------------- Encounter Discharge Information Details Patient Name: Jay Monaco A. Date of Service: 01/28/2020 1:00 PM Medical Record Number: [id number] Patient Account Number: [id number] Date of Birth/Sex: 1946-07-10 (74 y.o. M) Treating RN: Montey Hora Primary Care Aisley Whan: Lamonte Sakai Other Clinician: Referring [name]: Evern Bio Treating Lynae Pederson/Extender: Melburn Hake, HOYT Weeks in Treatment: 0 Encounter  Discharge Information Items Discharge Condition: Stable Ambulatory Status: Ambulatory Discharge Destination: Home Transportation: Private Auto Accompanied By: self Schedule Follow-up Appointment: Yes Clinical Summary of Care: Electronic Signature(s) Signed: 01/28/2020 3:58:18 PM By: Montey Hora Entered By: Montey Hora on 01/28/2020 13:58:48 Santiago, Saksham A. ([id number]) -------------------------------------------------------------------------------- Lower Extremity Assessment Details Patient Name: Huneycutt, Kaulin A. Date of Service: 01/28/2020 1:00 PM Medical Record Number: [id number] Patient Account Number: [id number] Date of Birth/Sex: Oct 27, 1945 (74 y.o. M) Treating RN: Army Melia Primary Care Adeline Petitfrere: Lamonte Sakai Other Clinician: Referring Diquan Kassis: Evern Bio Treating Ryla Cauthon/Extender: Melburn Hake, HOYT Weeks in Treatment: 0 Edema Assessment Assessed: [Left: No] [Right: No] Edema: [Left: No] [Right: No] Calf Left: Right: Point of Measurement: 33 cm From Medial Instep 40.5 cm 42.8 cm Ankle Left: Right: Point of Measurement: 11 cm From Medial Instep 30.5 cm 30 cm Vascular Assessment Pulses: Dorsalis Pedis Palpable: [Left:Yes] [Right:Yes] Notes Non-compressible bilaterally Electronic Signature(s) Signed: 01/28/2020 3:19:07 PM By: Army Melia Entered By: Army Melia on 01/28/2020 13:40:29 Ladd, Hudsyn A. ([id number]) -------------------------------------------------------------------------------- Multi Wound Chart Details Patient Name: Jay Monaco A. Date of Service: 01/28/2020 1:00 PM Medical Record Number: [id number] Patient Account Number: [id number] Date of Birth/Sex: [birth year] (74 y.o. M) Treating RN: Montey Hora Primary Care Jacob Chamblee: Lamonte Sakai Other Clinician: Referring Alawna Graybeal: Evern Bio Treating Latesia Norrington/Extender: Melburn Hake, HOYT Weeks in Treatment: 0 Vital Signs Height(in): 70 Pulse(bpm): 100 Weight(lbs): 254 Blood Pressure(mmHg):  155/83 Body Mass Index(BMI): 36 Temperature(F): 98.2 Respiratory Rate(breaths/min): 18 Photos: [N/A:N/A] Wound Location: Left Metatarsal head first N/A N/A Wounding Event: Chemical Burn N/A N/A Primary Etiology: Diabetic Wound/Ulcer of the Lower N/A N/A Extremity Comorbid History: Hypertension, Type II Diabetes N/A N/A Date Acquired: 01/21/2020 N/A N/A Weeks of Treatment: 0 N/A N/A Wound Status: Open N/A N/A Measurements L x W x D (cm) 4.5x4x0.1 N/A N/A Area (cm) : 14.137 N/A N/A Volume (cm) : 1.414 N/A N/A Classification: Grade 2 N/A N/A Exudate Amount: Medium N/A N/A Exudate Type: Serosanguineous N/A N/A Exudate Color: red, brown N/A N/A Granulation  Amount: None Present (0%) N/A N/A Necrotic Amount: Large (67-100%) N/A N/A Necrotic Tissue: Eschar N/A N/A Exposed Structures: Fascia: No N/A N/A Fat Layer (Subcutaneous Tissue) Exposed: No Tendon: No Muscle: No Joint: No Bone: No Epithelialization: None N/A N/A Treatment Notes Electronic Signature(s) Signed: 01/28/2020 3:58:18 PM By: Montey Hora Entered By: Montey Hora on 01/28/2020 13:52:10 Winker, Alhaji A. ([id number]) -------------------------------------------------------------------------------- Multi-Disciplinary Care Plan Details Patient Name: Jay Monaco A. Date of Service: 01/28/2020 1:00 PM Medical Record Number: [id number] Patient Account Number: [id number] Date of Birth/Sex: 04/10/1946 (74 y.o. M) Treating RN: Montey Hora Primary Care Phoenicia Pirie: Lamonte Sakai Other Clinician: Referring Jayvian Escoe: Evern Bio Treating Caliegh Middlekauff/Extender: Melburn Hake, HOYT Weeks in Treatment: 0 Active Inactive Abuse / Safety / Falls / Self Care Management Nursing Diagnoses: Potential for falls Goals: Patient will remain injury free related to falls Date Initiated: 01/28/2020 Target Resolution Date: 04/22/2020 Goal Status: Active Interventions: Assess fall risk on admission and as needed Notes: Necrotic  Tissue Nursing Diagnoses: Impaired tissue integrity related to necrotic/devitalized tissue Goals: Necrotic/devitalized tissue will be minimized in the wound bed Date Initiated: 01/28/2020 Target Resolution Date: 04/22/2020 Goal Status: Active Interventions: Provide education on necrotic tissue and debridement process Notes: Nutrition Nursing Diagnoses: Impaired glucose control: actual or potential Goals: Patient/caregiver agrees to and verbalizes understanding of need to use nutritional supplements and/or vitamins as prescribed Date Initiated: 01/28/2020 Target Resolution Date: 04/22/2020 Goal Status: Active Interventions: Assess patient nutrition upon admission and as needed per policy Notes: Orientation to the Wound Care Program Nursing Diagnoses: Knowledge deficit related to the wound healing center program Goals: Patient/caregiver will verbalize understanding of the [facility] Date Initiated: 01/28/2020 Target Resolution Date: 04/22/2020 Goal Status: Active Antos, Markeem A. ([id number]) Interventions: Provide education on orientation to the wound center Notes: Wound/Skin Impairment Nursing Diagnoses: Impaired tissue integrity Goals: Ulcer/skin breakdown will heal within 14 weeks Date Initiated: 01/28/2020 Target Resolution Date: 04/22/2020 Goal Status: Active Interventions: Assess patient/caregiver ability to obtain necessary supplies Assess patient/caregiver ability to perform ulcer/skin care regimen upon admission and as needed Assess ulceration(s) every visit Notes: Electronic Signature(s) Signed: 01/28/2020 3:58:18 PM By: Montey Hora Entered By: Montey Hora on 01/28/2020 13:51:52 Arbuthnot, Dujuan A. ([id number]) -------------------------------------------------------------------------------- Pain Assessment Details Patient Name: Gladwin, Breydon A. Date of Service: 01/28/2020 1:00 PM Medical Record Number: [id number] Patient Account Number:  [id number] Date of Birth/Sex: March 08, 1946 (74 y.o. M) Treating RN: Montey Hora Primary Care Laban Orourke: Lamonte Sakai Other Clinician: Referring Arlyn Bumpus: Evern Bio Treating Jantzen Pilger/Extender: Melburn Hake, HOYT Weeks in Treatment: 0 Active Problems Location of Pain Severity and Description of Pain Patient Has Paino No Site Locations Pain Management and Medication Current Pain Management: Electronic Signature(s) Signed: 01/28/2020 2:23:08 PM By: Paulla Fore, RRT, CHT Signed: 01/28/2020 3:58:18 PM By: Montey Hora Entered By: Lorine Bears on 01/28/2020 13:29:53 Stoll, Kenshin A. ([id number]) -------------------------------------------------------------------------------- Patient/Caregiver Education Details Patient Name: Jay Monaco A. Date of Service: 01/28/2020 1:00 PM Medical Record Number: [id number] Patient Account Number: [id number] Date of Birth/Gender: 09-Dec-1945 (74 y.o. M) Treating RN: Montey Hora Primary Care Physician: Lamonte Sakai Other Clinician: Referring Physician: Evern Bio Treating Physician/Extender: Sharalyn Ink in Treatment: 0 Education Assessment Education Provided To: Patient Education Topics Provided Wound/Skin Impairment: Handouts: Other: wound care and reportable s/s Methods: Explain/Verbal Responses: State content correctly Electronic Signature(s) Signed: 01/28/2020 3:58:18 PM By: Montey Hora Entered By: Montey Hora on 01/28/2020 13:58:16 Polinsky, Keltin A. ([id number]) -------------------------------------------------------------------------------- Wound Assessment Details Patient Name: Rasmussen, Toribio A. Date of Service: 01/28/2020 1:00 PM Medical  Record Number: [id number] Patient Account Number: [id number] Date of Birth/Sex: 08-22-1945 (74 y.o. M) Treating RN: Army Melia Primary Care Taye Cato: Lamonte Sakai Other Clinician: Referring Caysie Minnifield: Evern Bio Treating Joei Frangos/Extender: Melburn Hake, HOYT Weeks in Treatment: 0 Wound Status Wound Number: 1 Primary Etiology: Diabetic Wound/Ulcer of the Lower Extremity Wound Location: Left Metatarsal head first Wound Status: Open Wounding Event: Chemical Burn Comorbid History: Hypertension, Type II Diabetes Date Acquired: 01/21/2020 Weeks Of Treatment: 0 Clustered Wound: No Photos Wound Measurements Length: (cm) 4.5 Width: (cm) 4 Depth: (cm) 0.1 Area: (cm) 14.137 Volume: (cm) 1.414 % Reduction in Area: % Reduction in Volume: Epithelialization: None Tunneling: No Undermining: No Wound Description Classification: Grade 2 Exudate Amount: Medium Exudate Type: Serosanguineous Exudate Color: red, brown Foul Odor After Cleansing: No Slough/Fibrino Yes Wound Bed Granulation Amount: None Present (0%) Exposed Structure Necrotic Amount: Large (67-100%) Fascia Exposed: No Necrotic Quality: Eschar Fat Layer (Subcutaneous Tissue) Exposed: No Tendon Exposed: No Muscle Exposed: No Joint Exposed: No Bone Exposed: No Electronic Signature(s) Signed: 01/28/2020 3:19:07 PM By: Army Melia Entered By: Army Melia on 01/28/2020 13:39:46 Platte, Orien A. ([id number]) -------------------------------------------------------------------------------- Vitals Details Patient Name: Jay Monaco A. Date of Service: 01/28/2020 1:00 PM Medical Record Number: [id number] Patient Account Number: [id number] Date of Birth/Sex: [birth year] (74 y.o. M) Treating RN: Montey Hora Primary Care Mayar Whittier: Lamonte Sakai Other Clinician: Referring Dena Esperanza: Evern Bio Treating Matelyn Antonelli/Extender: Melburn Hake, HOYT Weeks in Treatment: 0 Vital Signs Time Taken: 13:25 Temperature (F): 98.2 Height (in): 70 Pulse (bpm): 64 Source: Stated Respiratory Rate (breaths/min): 18 Weight (lbs): 254 Blood Pressure (mmHg): 155/83 Source: Measured Reference Range: 80 - 120 mg / dl Body Mass Index (BMI): 36.4 Electronic Signature(s) Signed: 01/28/2020  2:23:08 PM By: Lorine Bears RCP, RRT, CHT Entered By: Lorine Bears on 01/28/2020 13:31:44

## 2020-02-04 ENCOUNTER — Encounter: Payer: Medicare HMO | Admitting: Physician Assistant

## 2020-02-04 ENCOUNTER — Other Ambulatory Visit: Payer: Self-pay

## 2020-02-04 DIAGNOSIS — T25722A Corrosion of third degree of left foot, initial encounter: Secondary | ICD-10-CM | POA: Diagnosis not present
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
## 2020-02-22 ENCOUNTER — Encounter: Payer: Medicare HMO | Attending: Physician Assistant | Admitting: Physician Assistant

## 2020-02-22 ENCOUNTER — Other Ambulatory Visit: Payer: Self-pay

## 2020-02-22 DIAGNOSIS — I10 Essential (primary) hypertension: Secondary | ICD-10-CM | POA: Insufficient documentation

## 2020-02-22 DIAGNOSIS — Z833 Family history of diabetes mellitus: Secondary | ICD-10-CM | POA: Insufficient documentation

## 2020-02-22 DIAGNOSIS — T6591XA Toxic effect of unspecified substance, accidental (unintentional), initial encounter: Secondary | ICD-10-CM | POA: Diagnosis not present

## 2020-02-22 DIAGNOSIS — E1151 Type 2 diabetes mellitus with diabetic peripheral angiopathy without gangrene: Secondary | ICD-10-CM | POA: Diagnosis not present

## 2020-02-22 DIAGNOSIS — T25722A Corrosion of third degree of left foot, initial encounter: Secondary | ICD-10-CM | POA: Diagnosis present

## 2020-02-22 DIAGNOSIS — E119 Type 2 diabetes mellitus without complications: Secondary | ICD-10-CM | POA: Insufficient documentation

## 2020-02-22 DIAGNOSIS — X58XXXA Exposure to other specified factors, initial encounter: Secondary | ICD-10-CM | POA: Insufficient documentation

## 2020-02-22 DIAGNOSIS — E11621 Type 2 diabetes mellitus with foot ulcer: Secondary | ICD-10-CM | POA: Diagnosis not present

## 2020-02-22 DIAGNOSIS — Y939 Activity, unspecified: Secondary | ICD-10-CM | POA: Insufficient documentation

## 2020-02-22 DIAGNOSIS — Z8249 Family history of ischemic heart disease and other diseases of the circulatory system: Secondary | ICD-10-CM | POA: Diagnosis not present
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
## 2020-03-07 ENCOUNTER — Other Ambulatory Visit: Payer: Self-pay

## 2020-03-07 ENCOUNTER — Encounter: Payer: Medicare HMO | Admitting: Physician Assistant

## 2020-03-07 DIAGNOSIS — T25722A Corrosion of third degree of left foot, initial encounter: Secondary | ICD-10-CM | POA: Diagnosis not present
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
## 2020-03-21 ENCOUNTER — Other Ambulatory Visit: Payer: Self-pay

## 2020-03-21 ENCOUNTER — Encounter: Payer: Medicare HMO | Attending: Physician Assistant | Admitting: Physician Assistant

## 2020-03-21 DIAGNOSIS — T6591XA Toxic effect of unspecified substance, accidental (unintentional), initial encounter: Secondary | ICD-10-CM | POA: Diagnosis not present

## 2020-03-21 DIAGNOSIS — T25722A Corrosion of third degree of left foot, initial encounter: Secondary | ICD-10-CM | POA: Insufficient documentation

## 2020-03-21 DIAGNOSIS — I10 Essential (primary) hypertension: Secondary | ICD-10-CM | POA: Insufficient documentation

## 2020-03-21 DIAGNOSIS — E11621 Type 2 diabetes mellitus with foot ulcer: Secondary | ICD-10-CM | POA: Diagnosis not present
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
## 2020-04-04 ENCOUNTER — Other Ambulatory Visit: Payer: Self-pay

## 2020-04-04 ENCOUNTER — Encounter: Payer: Medicare HMO | Admitting: Physician Assistant

## 2020-04-04 DIAGNOSIS — E11621 Type 2 diabetes mellitus with foot ulcer: Secondary | ICD-10-CM | POA: Diagnosis not present

## 2020-04-04 NOTE — Progress Notes (Addendum)
[name] ([id number]) Visit Report for 04/04/2020 Arrival Information Details Patient Name: [name], [name] Date of Service: 04/04/2020 3:15 PM Medical Record Number: [id number] Patient Account Number: [id number] Date of Birth/Sex: 12-Sep-1945 (74 y.o. M) Treating RN: [name] Primary Care [name]: [name] Other Clinician: Referring [name]: [name] Treating [name]/Extender: [name], [name] Weeks in Treatment: 9 Visit Information History Since Last Visit All ordered tests and consults were completed: No Patient Arrived: Ambulatory Added or deleted any medications: No Arrival Time: 15:26 Any new allergies or adverse reactions: No Accompanied By: self Had a fall or experienced change in No Transfer Assistance: None activities of daily living that may affect Patient Identification Verified: Yes risk of falls: Secondary Verification Process Completed: Yes Signs or symptoms of abuse/neglect since last visito No Patient Requires Transmission-Based No Hospitalized since last visit: No Precautions: Implantable device outside of the clinic excluding No Patient Has Alerts: Yes cellular tissue based products placed in the center Patient Alerts: Patient on Blood since last visit: Thinner Has Dressing in Place as Prescribed: Yes DMII Pain Present Now: No aspirin 81 Non-compressible Electronic Signature(s) Signed: 04/06/2020 10:49:47 AM By: [name] Entered By: [name] on 04/04/2020 15:27:57 [name], [name] ([id number]) -------------------------------------------------------------------------------- Encounter Discharge Information Details Patient Name: [name] A. Date of Service: 04/04/2020 3:15 PM Medical Record Number: [id number] Patient Account Number: [id number] Date of Birth/Sex: December 14, 1945 (74 y.o. M) Treating RN: [name] Primary Care [name]: [name] Other Clinician: Referring [name]: [name] Treating [name]/Extender:  [name], [name] Weeks in Treatment: 9 Encounter Discharge Information Items Post Procedure Vitals Discharge Condition: Stable Temperature (F): 97.7 Ambulatory Status: Ambulatory Pulse (bpm): 57 Discharge Destination: Home Respiratory Rate (breaths/min): 18 Transportation: Private Auto Blood Pressure (mmHg): 165/82 Accompanied By: self Schedule Follow-up Appointment: Yes Clinical Summary of Care: Electronic Signature(s) Signed: 04/06/2020 6:39:29 PM By: [name], BSN, RN, CWS, Kim RN, BSN Entered By: [name], BSN, RN, CWS, [name] on 04/04/2020 15:52:15 [name], [name] ([id number]) -------------------------------------------------------------------------------- Lower Extremity Assessment Details Patient Name: [name] A. Date of Service: 04/04/2020 3:15 PM Medical Record Number: [id number] Patient Account Number: [id number] Date of Birth/Sex: 01/11/1946 (74 y.o. M) Treating RN: [name] Primary Care [name]: [name] Other Clinician: Referring [name]: [name] Treating [name]/Extender: [name], [name] Weeks in Treatment: 9 Edema Assessment Assessed: [Left: Yes] [Right: No] Edema: [Left: Ye] [Right: s] Calf Left: Right: Point of Measurement: 40.5 cm From Medial Instep 39.5 cm cm Ankle Left: Right: Point of Measurement: 12 cm From Medial Instep 31 cm cm Vascular Assessment Pulses: Dorsalis Pedis Palpable: [Left:Yes] Posterior Tibial Palpable: [Left:Yes] Electronic Signature(s) Signed: 04/06/2020 10:49:47 AM By: [name] Signed: 04/06/2020 6:39:29 PM By: [name], BSN, RN, CWS, Kim RN, BSN Entered By: [name] on 04/04/2020 15:40:27 [name], [name] ([id number]) -------------------------------------------------------------------------------- Multi Wound Chart Details Patient Name: [name], [name] Date of Service: 04/04/2020 3:15 PM Medical Record Number: [id number] Patient Account Number: [id number] Date of Birth/Sex: September 13, 1945 (74 y.o. M) Treating RN:  [name] Primary Care [name]: [name] Other Clinician: Referring [name]: [name] Treating [name]/Extender: [name], [name] Weeks in Treatment: 9 Vital Signs Height(in): 70 Pulse(bpm): 71 Weight(lbs): 59 Blood Pressure(mmHg): 165/82 Body Mass Index(BMI): 36 Temperature(F): 97.7 Respiratory Rate(breaths/min): 18 Photos: [N/A:N/A] Wound Location: Left Metatarsal head first N/A N/A Wounding Event: Chemical Burn N/A N/A Primary Etiology: 3rd degree Burn N/A N/A Secondary Etiology: Diabetic Wound/Ulcer of the Lower N/A N/A Extremity Comorbid History: Hypertension, Type II Diabetes N/A N/A Date Acquired: 01/21/2020 N/A N/A Weeks of  Treatment: 9 N/A N/A Wound Status: Open N/A N/A Measurements L x W x D (cm) 3x2.5x0.1 N/A N/A Area (cm) : 5.89 N/A N/A Volume (cm) : 0.589 N/A N/A % Reduction in Area: 58.30% N/A N/A % Reduction in Volume: 58.30% N/A N/A Classification: Unclassifiable N/A N/A Exudate Amount: Medium N/A N/A Exudate Type: Serosanguineous N/A N/A Exudate Color: red, brown N/A N/A Wound Margin: Flat and Intact N/A N/A Granulation Amount: None Present (0%) N/A N/A Necrotic Amount: Large (67-100%) N/A N/A Exposed Structures: Fat Layer (Subcutaneous Tissue) N/A N/A Exposed: Yes Fascia: No Tendon: No Muscle: No Joint: No Bone: No Epithelialization: None N/A N/A Treatment Notes Electronic Signature(s) Signed: 04/06/2020 6:39:29 PM By: [name], BSN, RN, CWS, Kim RN, BSN Entered By: [name], BSN, RN, CWS, [name] on 04/04/2020 15:48:58 [name], [name] ([id number]) -------------------------------------------------------------------------------- Multi-Disciplinary Care Plan Details Patient Name: [name] A. Date of Service: 04/04/2020 3:15 PM Medical Record Number: [id number] Patient Account Number: [id number] Date of Birth/Sex: 12/21/1945 (74 y.o. M) Treating RN: [name] Primary Care [name]: [name] Other Clinician: Referring [name]: [name] Treating [name]/Extender: [name], [name] Weeks in Treatment: 9 Active Inactive Abuse / Safety / Falls / Self Care Management Nursing Diagnoses: Potential for falls Goals: Patient will remain injury free related to falls Date Initiated: 01/28/2020 Target Resolution Date: 04/22/2020 Goal Status: Active Interventions: Assess fall risk on admission and as needed Notes: Necrotic Tissue Nursing Diagnoses: Impaired tissue integrity related to necrotic/devitalized tissue Goals: Necrotic/devitalized tissue will be minimized in the wound bed Date Initiated: 01/28/2020 Target Resolution Date: 04/22/2020 Goal Status: Active Interventions: Provide education on necrotic tissue and debridement process Notes: Nutrition Nursing Diagnoses: Impaired glucose control: actual or potential Goals: Patient/caregiver agrees to and verbalizes understanding of need to use nutritional supplements and/or vitamins as prescribed Date Initiated: 01/28/2020 Target Resolution Date: 04/22/2020 Goal Status: Active Interventions: Assess patient nutrition upon admission and as needed per policy Notes: Orientation to the Wound Care Program Nursing Diagnoses: Knowledge deficit related to the wound healing center program Goals: Patient/caregiver will verbalize understanding of the [place] Program Date Initiated: 01/28/2020 Target Resolution Date: 04/22/2020 Goal Status: Active [name], [name] ([id number]) Interventions: Provide education on orientation to the wound center Notes: Wound/Skin Impairment Nursing Diagnoses: Impaired tissue integrity Goals: Ulcer/skin breakdown will heal within 14 weeks Date Initiated: 01/28/2020 Target Resolution Date: 04/22/2020 Goal Status: Active Interventions: Assess patient/caregiver ability to obtain necessary supplies Assess patient/caregiver ability to perform ulcer/skin care regimen upon admission and as needed Assess ulceration(s) every  visit Notes: Electronic Signature(s) Signed: 04/06/2020 6:39:29 PM By: [name], BSN, RN, CWS, Kim RN, BSN Entered By: [name], BSN, RN, CWS, [name] on 04/04/2020 15:48:46 [name], [name] ([id number]) -------------------------------------------------------------------------------- Pain Assessment Details Patient Name: [name] A. Date of Service: 04/04/2020 3:15 PM Medical Record Number: [id number] Patient Account Number: [id number] Date of Birth/Sex: 1946/06/23 (74 y.o. M) Treating RN: [name] Primary Care Reyah Streeter: [name] Other Clinician: Referring Desmin Daleo: [name] Treating Penny Frisbie/Extender: [name], [name] Weeks in Treatment: 9 Active Problems Location of Pain Severity and Description of Pain Patient Has Paino No Site Locations With Dressing Change: No Pain Management and Medication Current Pain Management: Electronic Signature(s) Signed: 04/06/2020 10:49:47 AM By: [name] Signed: 04/06/2020 6:39:29 PM By: [name], BSN, RN, CWS, Kim RN, BSN Entered By: [name] on 04/04/2020 15:29:21 [name] ([id number]) -------------------------------------------------------------------------------- Patient/Caregiver Education Details Patient Name: [name] A. Date of Service: 04/04/2020 3:15 PM Medical Record Number: [id number] Patient Account Number: [id number] Date of Birth/Gender:  [birth year] (74 y.o. M) Treating RN: [name] Primary Care Physician: [name] Other Clinician: Referring Physician: Lamonte [name] Treating Physician/Extender: Sharalyn Ink in Treatment: 9 Education Assessment Education Provided To: Patient Education Topics Provided Wound/Skin Impairment: Handouts: Other: continue daily dressing changes with Santyl Methods: Demonstration Responses: State content correctly Electronic Signature(s) Signed: 04/06/2020 6:39:29 PM By: [name], BSN, RN, CWS, Kim RN, BSN Entered By: [name], BSN, RN, CWS, [name] on 04/04/2020  15:51:18 [name], Jay [place]. ([id number]) -------------------------------------------------------------------------------- Wound Assessment Details Patient Name: [name], [name] Date of Service: 04/04/2020 3:15 PM Medical Record Number: [id number] Patient Account Number: [id number] Date of Birth/Sex: Aug 02, 1946 (74 y.o. M) Treating RN: [name] Primary Care Katria Botts: [name] Other Clinician: Referring Jermain Curt: [name] Treating Caroll Cunnington/Extender: [name], [name] Weeks in Treatment: 9 Wound Status Wound Number: 1 Primary Etiology: 3rd degree Burn Wound Location: Left Metatarsal head first Secondary Etiology: Diabetic Wound/Ulcer of the Lower Extremity Wounding Event: Chemical Burn Wound Status: Open Date Acquired: 01/21/2020 Comorbid History: Hypertension, Type II Diabetes Weeks Of Treatment: 9 Clustered Wound: No Photos Wound Measurements Length: (cm) 3 Width: (cm) 2.5 Depth: (cm) 0.1 Area: (cm) 5.89 Volume: (cm) 0.589 % Reduction in Area: 58.3% % Reduction in Volume: 58.3% Epithelialization: None Wound Description Classification: Unclassifiable F Wound Margin: Flat and Intact S Exudate Amount: Medium Exudate Type: Serosanguineous Exudate Color: red, brown oul Odor After Cleansing: No lough/Fibrino Yes Wound Bed Granulation Amount: None Present (0%) Exposed Structure Necrotic Amount: Large (67-100%) Fascia Exposed: No Necrotic Quality: Adherent Slough Fat Layer (Subcutaneous Tissue) Exposed: Yes Tendon Exposed: No Muscle Exposed: No Joint Exposed: No Bone Exposed: No Treatment Notes Wound #1 (Left Metatarsal head first) Notes santyl, saline moistened gauze, telfa Environmental health practitioner Signature(s) Signed: 04/06/2020 10:49:47 AM By: Lissa Morales ([id number]) Signed: 04/06/2020 6:39:29 PM By: [name], BSN, RN, CWS, Kim RN, BSN Entered By: [name] on 04/04/2020 15:36:16 [name], [name]  ([id number]) -------------------------------------------------------------------------------- Vitals Details Patient Name: [name], Nichalas A. Date of Service: 04/04/2020 3:15 PM Medical Record Number: [id number] Patient Account Number: [id number] Date of Birth/Sex: 16-May-1946 (74 y.o. M) Treating RN: [name] Primary Care Emmauel Hallums: [name] Other Clinician: Referring Jabrea Kallstrom: [name] Treating Chavy Avera/Extender: [name], [name] Weeks in Treatment: 9 Vital Signs Time Taken: 03:20 Temperature (F): 97.7 Height (in): 70 Pulse (bpm): 57 Weight (lbs): 254 Respiratory Rate (breaths/min): 18 Body Mass Index (BMI): 36.4 Blood Pressure (mmHg): 165/82 Reference Range: 80 - 120 mg / dl Electronic Signature(s) Signed: 04/06/2020 10:49:47 AM By: [name] Entered By: [name] on 04/04/2020 15:29:04
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
## 2020-04-18 ENCOUNTER — Encounter: Payer: Medicare HMO | Admitting: Physician Assistant

## 2020-04-18 ENCOUNTER — Other Ambulatory Visit: Payer: Self-pay

## 2020-04-18 DIAGNOSIS — E11621 Type 2 diabetes mellitus with foot ulcer: Secondary | ICD-10-CM | POA: Diagnosis not present
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
## 2020-05-02 ENCOUNTER — Encounter: Payer: Medicare HMO | Attending: Physician Assistant | Admitting: Physician Assistant

## 2020-05-02 ENCOUNTER — Other Ambulatory Visit: Payer: Self-pay

## 2020-05-02 DIAGNOSIS — T25722A Corrosion of third degree of left foot, initial encounter: Secondary | ICD-10-CM | POA: Insufficient documentation

## 2020-05-02 DIAGNOSIS — T25722D Corrosion of third degree of left foot, subsequent encounter: Secondary | ICD-10-CM | POA: Diagnosis present

## 2020-05-02 DIAGNOSIS — T6591XA Toxic effect of unspecified substance, accidental (unintentional), initial encounter: Secondary | ICD-10-CM | POA: Insufficient documentation

## 2020-05-02 DIAGNOSIS — E11621 Type 2 diabetes mellitus with foot ulcer: Secondary | ICD-10-CM | POA: Insufficient documentation

## 2020-05-02 NOTE — Progress Notes (Signed)
[name], [name] ([id number]) Visit Report for 05/02/2020 Arrival Information Details Patient Name: [name], [name] Date of Service: 05/02/2020 2:30 PM Medical Record Number: [id number] Patient Account Number: [id number] Date of Birth/Sex: 1946-03-30 (74 y.o. M) Treating RN: [name] Primary Care [name]: [name] Other Clinician: Referring [name]: [name] Treating [name]/Extender: [name], [name] [name]: 83 Visit Information History Since Last Visit Added or deleted any medications: No Patient Arrived: Ambulatory Any new allergies or adverse reactions: No Arrival Time: 14:39 Had a fall or experienced change in No Accompanied By: self activities of daily living that may affect Transfer Assistance: None risk of falls: Patient Identification Verified: Yes Signs or symptoms of abuse/neglect since last visito No Secondary Verification Process Completed: Yes Hospitalized since last visit: No Patient Requires Transmission-Based No Implantable device outside of the clinic excluding No Precautions: cellular tissue based products placed in the center Patient Has Alerts: Yes since last visit: Patient Alerts: Patient on Blood Has Dressing in Place as Prescribed: Yes Thinner Has Compression in Place as Prescribed: Yes DMII Pain Present Now: Yes aspirin 81 Non-compressible Electronic Signature(s) Signed: 05/02/2020 4:39:01 PM By: [name] Entered By: Jay Bears on 05/02/2020 14:42:25 [name], [name] ([id number]) -------------------------------------------------------------------------------- Clinic Level of Care Assessment Details Patient Name: [name], [name] Date of Service: 05/02/2020 2:30 PM Medical Record Number: [id number] Patient Account Number: [id number] Date of Birth/Sex: [birth year] (74 y.o. M) Treating RN: [name] Primary Care Tria Noguera: [name] Other Clinician: Referring [name]:  [name] Treating [name]: [name], [name] [name]: 13 Clinic Level of Care Assessment Items TOOL 4 Quantity Score []  - Use when only an EandM is performed on FOLLOW-UP visit 0 ASSESSMENTS - Nursing Assessment / Reassessment X - Reassessment of Co-morbidities (includes updates in patient status) 1 10 X- 1 5 Reassessment of Adherence to [name] Plan ASSESSMENTS - Wound and Skin Assessment / Reassessment X - Simple Wound Assessment / Reassessment - one wound 1 5 []  - 0 Complex Wound Assessment / Reassessment - multiple wounds []  - 0 Dermatologic / Skin Assessment (not related to wound area) ASSESSMENTS - Focused Assessment []  - Circumferential Edema Measurements - multi extremities 0 []  - 0 Nutritional Assessment / Counseling / Intervention X- 1 5 Lower Extremity Assessment (monofilament, tuning fork, pulses) []  - 0 Peripheral Arterial Disease Assessment (using hand held doppler) ASSESSMENTS - Ostomy and/or Continence Assessment and Care []  - Incontinence Assessment and Management 0 []  - 0 Ostomy Care Assessment and Management (repouching, etc.) PROCESS - Coordination of Care X - Simple Patient / Family Education for ongoing care 1 15 []  - 0 Complex (extensive) Patient / Family Education for ongoing care []  - 0 Staff obtains Programmer, systems, Records, Test Results / Process Orders []  - 0 Staff telephones HHA, Nursing Homes / Clarify orders / etc []  - 0 Routine Transfer to another Facility (non-emergent condition) []  - 0 Routine Hospital Admission (non-emergent condition) []  - 0 New Admissions / Biomedical engineer / Ordering NPWT, Apligraf, etc. []  - 0 Emergency Hospital Admission (emergent condition) X- 1 10 Simple Discharge Coordination []  - 0 Complex (extensive) Discharge Coordination PROCESS - Special Needs []  - Pediatric / Minor Patient Management 0 []  - 0 Isolation Patient Management []  - 0 Hearing / Language / Visual special needs []   - 0 Assessment of Community assistance (transportation, D/C planning, etc.) []  - 0 Additional assistance / Altered mentation []  - 0 Support Surface(s) Assessment (bed, cushion, seat, etc.) INTERVENTIONS - Wound  Cleansing / Measurement [name], [name] ([id number]) X- 1 5 Simple Wound Cleansing - one wound []  - 0 Complex Wound Cleansing - multiple wounds X- 1 5 Wound Imaging (photographs - any number of wounds) []  - 0 Wound Tracing (instead of photographs) X- 1 5 Simple Wound Measurement - one wound []  - 0 Complex Wound Measurement - multiple wounds INTERVENTIONS - Wound Dressings X - Small Wound Dressing one or multiple wounds 1 10 []  - 0 Medium Wound Dressing one or multiple wounds []  - 0 Large Wound Dressing one or multiple wounds []  - 0 Application of Medications - topical []  - 0 Application of Medications - injection INTERVENTIONS - Miscellaneous []  - External ear exam 0 []  - 0 Specimen Collection (cultures, biopsies, blood, body fluids, etc.) []  - 0 Specimen(s) / Culture(s) sent or taken to Lab for analysis []  - 0 Patient Transfer (multiple staff / Civil Service fast streamer / Similar devices) []  - 0 Simple Staple / Suture removal (25 or less) []  - 0 Complex Staple / Suture removal (26 or more) []  - 0 Hypo / Hyperglycemic Management (close monitor of Blood Glucose) []  - 0 Ankle / Brachial Index (ABI) - do not check if billed separately X- 1 5 Vital Signs Has the patient been seen at the hospital within the last three years: Yes Total Score: 80 Level Of Care: New/Established - Level 3 Electronic Signature(s) Signed: 05/02/2020 4:44:41 PM By: [name] Entered By: [name] on 05/02/2020 15:23:58 [name], [name] ([id number]) -------------------------------------------------------------------------------- Encounter Discharge Information Details Patient Name: Jay Monaco A. Date of Service: 05/02/2020 2:30 PM Medical Record Number: [id number] Patient Account  Number: [id number] Date of Birth/Sex: 25-Jan-1946 (74 y.o. M) Treating RN: [name] Primary Care Tymesha Ditmore: [name] Other Clinician: Referring [name]: [name] Treating Jay Fells/Extender: [name], [name] [name]: 13 Encounter Discharge Information Items Discharge Condition: Stable Ambulatory Status: Ambulatory Discharge Destination: Home Transportation: Private Auto Accompanied By: self Schedule Follow-up Appointment: Yes Clinical Summary of Care: Electronic Signature(s) Signed: 05/02/2020 4:44:41 PM By: [name] Entered By: [name] on 05/02/2020 15:25:10 Gorman, Natnael A. ([id number]) -------------------------------------------------------------------------------- Lower Extremity Assessment Details Patient Name: Bowermaster, Zacharie A. Date of Service: 05/02/2020 2:30 PM Medical Record Number: [id number] Patient Account Number: [id number] Date of Birth/Sex: 1946-03-20 (74 y.o. M) Treating RN: [name] Primary Care Cainan Trull: [name] Other Clinician: Referring Carey Johndrow: [name] Treating Fronia Depass/Extender: [name], [name] [name]: 13 Edema Assessment Assessed: [Left: Yes] [Right: No] Edema: [Left: Ye] [Right: s] Calf Left: Right: Point of Measurement: 40.5 cm From Medial Instep 36 cm cm Ankle Left: Right: Point of Measurement: 12 cm From Medial Instep 30.5 cm cm Vascular Assessment Pulses: Dorsalis Pedis Palpable: [Left:Yes] Posterior Tibial Palpable: [Left:Yes] Electronic Signature(s) Signed: 05/02/2020 3:14:40 PM By: Darci Needle Signed: 05/02/2020 4:28:55 PM By: Gretta Cool, BSN, RN, CWS, Kim RN, BSN Entered By: Darci Needle on 05/02/2020 14:52:21 Breden, Dalbert A. ([id number]) -------------------------------------------------------------------------------- Multi Wound Chart Details Patient Name: [name], [name] Date of Service: 05/02/2020 2:30 PM Medical Record Number: [id number] Patient Account Number:  [id number] Date of Birth/Sex: [birth year] (74 y.o. M) Treating RN: [name] Primary Care Arneda Sappington: [name] Other Clinician: Referring Kanesha Cadle: [name] Treating Brent Taillon/Extender: [name], [name] [name]: 13 Vital Signs Height(in): 70 Pulse(bpm): 77 Weight(lbs): 35 Blood Pressure(mmHg): 160/82 Body Mass Index(BMI): 36 Temperature(F): 98.1 Respiratory Rate(breaths/min): 18 Photos: [N/A:N/A] Wound Location: Left Metatarsal head first N/A N/A Wounding Event: Chemical Burn N/A N/A Primary Etiology: 3rd degree Burn N/A N/A Secondary Etiology: Diabetic  Wound/Ulcer of the Lower N/A N/A Extremity Comorbid History: Hypertension, Type II Diabetes N/A N/A Date Acquired: 01/21/2020 N/A N/A Jay of [name]: 13 N/A N/A Wound Status: Open N/A N/A Measurements L x W x D (cm) 2x1.8x0.1 N/A N/A Area (cm) : 2.827 N/A N/A Volume (cm) : 0.283 N/A N/A % Reduction in Area: 80.00% N/A N/A % Reduction in Volume: 80.00% N/A N/A Classification: Unclassifiable N/A N/A Exudate Amount: Medium N/A N/A Exudate Type: Serosanguineous N/A N/A Exudate Color: red, brown N/A N/A Wound Margin: Flat and Intact N/A N/A Granulation Amount: None Present (0%) N/A N/A Necrotic Amount: Large (67-100%) N/A N/A Exposed Structures: Fat Layer (Subcutaneous Tissue): N/A N/A Yes Fascia: No Tendon: No Muscle: No Joint: No Bone: No Epithelialization: None N/A N/A [name] Notes Electronic Signature(s) Signed: 05/02/2020 4:44:41 PM By: [name] Entered By: [name] on 05/02/2020 15:22:50 Gibler, Akari A. ([id number]) -------------------------------------------------------------------------------- Multi-Disciplinary Care Plan Details Patient Name: [name], Augustin A. Date of Service: 05/02/2020 2:30 PM Medical Record Number: [id number] Patient Account Number: [id number] Date of Birth/Sex: 11-30-1945 (74 y.o. M) Treating RN: [name] Primary Care Dashun Borre: [name]  Other Clinician: Referring Constantin Hillery: [name] Treating Maclovia Uher/Extender: [name], [name] [name]: 5 Active Inactive Abuse / Safety / Falls / Self Care Management Nursing Diagnoses: Potential for falls Goals: Patient will remain injury free related to falls Date Initiated: 01/28/2020 Target Resolution Date: 04/22/2020 Goal Status: Active Interventions: Assess fall risk on admission and as needed Notes: Necrotic Tissue Nursing Diagnoses: Impaired tissue integrity related to necrotic/devitalized tissue Goals: Necrotic/devitalized tissue will be minimized in the wound bed Date Initiated: 01/28/2020 Target Resolution Date: 04/22/2020 Goal Status: Active Interventions: Provide education on necrotic tissue and debridement process Notes: Nutrition Nursing Diagnoses: Impaired glucose control: actual or potential Goals: Patient/caregiver agrees to and verbalizes understanding of need to use nutritional supplements and/or vitamins as prescribed Date Initiated: 01/28/2020 Target Resolution Date: 04/22/2020 Goal Status: Active Interventions: Assess patient nutrition upon admission and as needed per policy Notes: Orientation to the Wound Care Program Nursing Diagnoses: Knowledge deficit related to the wound healing center program Goals: Patient/caregiver will verbalize understanding of the [facility] Date Initiated: 01/28/2020 Target Resolution Date: 04/22/2020 Goal Status: Active Weckwerth, Karsin A. ([id number]) Interventions: Provide education on orientation to the wound center Notes: Wound/Skin Impairment Nursing Diagnoses: Impaired tissue integrity Goals: Ulcer/skin breakdown will heal within 14 Jay Date Initiated: 01/28/2020 Target Resolution Date: 04/22/2020 Goal Status: Active Interventions: Assess patient/caregiver ability to obtain necessary supplies Assess patient/caregiver ability to perform ulcer/skin care regimen upon admission and as  needed Assess ulceration(s) every visit Notes: Electronic Signature(s) Signed: 05/02/2020 4:44:41 PM By: [name] Entered By: [name] on 05/02/2020 15:22:44 Haskell, Michiel A. ([id number]) -------------------------------------------------------------------------------- Pain Assessment Details Patient Name: [name], [name] Date of Service: 05/02/2020 2:30 PM Medical Record Number: [id number] Patient Account Number: [id number] Date of Birth/Sex: 08-03-1946 (74 y.o. M) Treating RN: [name] Primary Care Treniya Lobb: [name] Other Clinician: Referring Jenna Routzahn: [name] Treating Lyrick Worland/Extender: [name], [name] [name]: 13 Active Problems Location of Pain Severity and Description of Pain Patient Has Paino No Site Locations With Dressing Change: No Pain Management and Medication Current Pain Management: Electronic Signature(s) Signed: 05/02/2020 3:14:40 PM By: Darci Needle Signed: 05/02/2020 4:28:55 PM By: Gretta Cool, BSN, RN, CWS, Kim RN, BSN Entered By: Darci Needle on 05/02/2020 14:50:02 Fornes, Kayston AMarland Kitchen ([id number]) -------------------------------------------------------------------------------- Patient/Caregiver Education Details Patient Name: Jay Monaco A. Date of Service: 05/02/2020 2:30 PM Medical Record Number: [id number] Patient Account  Number: [id number] Date of Birth/Gender: 10-Jan-1946 (74 y.o. M) Treating RN: [name] Primary Care Physician: [name] Other Clinician: Referring Physician: Lamonte [name] Treating Physician/Extender: Sharalyn Ink in [name]: 13 Education Assessment Education Provided To: Patient Education Topics Provided Wound/Skin Impairment: Handouts: Caring for Your Ulcer Methods: Explain/Verbal Responses: State content correctly Electronic Signature(s) Signed: 05/02/2020 4:44:41 PM By: [name] Entered By: [name] on 05/02/2020 15:24:17 Vonstein, [name].  ([id number]) -------------------------------------------------------------------------------- Wound Assessment Details Patient Name: Mellone, Koleson A. Date of Service: 05/02/2020 2:30 PM Medical Record Number: [id number] Patient Account Number: [id number] Date of Birth/Sex: 04/02/1946 (74 y.o. M) Treating RN: [name] Primary Care Kalifa Cadden: [name] Other Clinician: Referring Kenyanna Grzesiak: [name] Treating Eniola Cerullo/Extender: [name], [name] [name]: 13 Wound Status Wound Number: 1 Primary Etiology: 3rd degree Burn Wound Location: Left Metatarsal head first Secondary Etiology: Diabetic Wound/Ulcer of the Lower Extremity Wounding Event: Chemical Burn Wound Status: Open Date Acquired: 01/21/2020 Comorbid History: Hypertension, Type II Diabetes Jay Of [name]: 13 Clustered Wound: No Photos Wound Measurements Length: (cm) 2 % R Width: (cm) 1.8 % R Depth: (cm) 0.1 Epi Area: (cm) 2.827 Volume: (cm) 0.283 eduction in Area: 80% eduction in Volume: 80% thelialization: None Wound Description Classification: Unclassifiable Fou Wound Margin: Flat and Intact Slo Exudate Amount: Medium Exudate Type: Serosanguineous Exudate Color: red, brown l Odor After Cleansing: No ugh/Fibrino Yes Wound Bed Granulation Amount: None Present (0%) Exposed Structure Necrotic Amount: Large (67-100%) Fascia Exposed: No Necrotic Quality: Adherent Slough Fat Layer (Subcutaneous Tissue) Exposed: Yes Tendon Exposed: No Muscle Exposed: No Joint Exposed: No Bone Exposed: No [name] Notes Wound #1 (Left Metatarsal head first) Notes prisma, saline moistened gauze, BFD Electronic Signature(s) Signed: 05/02/2020 3:14:40 PM By: Lissa Morales ([id number]) Signed: 05/02/2020 4:28:55 PM By: Gretta Cool, BSN, RN, CWS, Kim RN, BSN Entered By: Darci Needle on 05/02/2020 14:51:16 Yaun, Jaimie A.  ([id number]) -------------------------------------------------------------------------------- Vitals Details Patient Name: [name], Jerelle A. Date of Service: 05/02/2020 2:30 PM Medical Record Number: [id number] Patient Account Number: [id number] Date of Birth/Sex: 05/26/1946 (74 y.o. M) Treating RN: [name] Primary Care Kemisha Bonnette: [name] Other Clinician: Referring Yaffa Seckman: [name] Treating Morgen Linebaugh/Extender: [name], [name] [name]: 13 Vital Signs Time Taken: 14:40 Temperature (F): 98.1 Height (in): 70 Pulse (bpm): 75 Weight (lbs): 254 Respiratory Rate (breaths/min): 18 Body Mass Index (BMI): 36.4 Blood Pressure (mmHg): 160/82 Reference Range: 80 - 120 mg / dl Electronic Signature(s) Signed: 05/02/2020 4:39:01 PM By: [name] Entered By: Jay Bears on 05/02/2020 14:43:05
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
## 2020-05-23 ENCOUNTER — Other Ambulatory Visit: Payer: Self-pay

## 2020-05-23 ENCOUNTER — Encounter: Payer: Medicare HMO | Attending: Physician Assistant | Admitting: Physician Assistant

## 2020-05-23 DIAGNOSIS — T6591XD Toxic effect of unspecified substance, accidental (unintentional), subsequent encounter: Secondary | ICD-10-CM | POA: Insufficient documentation

## 2020-05-23 DIAGNOSIS — L97529 Non-pressure chronic ulcer of other part of left foot with unspecified severity: Secondary | ICD-10-CM | POA: Insufficient documentation

## 2020-05-23 DIAGNOSIS — T25422D Corrosion of unspecified degree of left foot, subsequent encounter: Secondary | ICD-10-CM | POA: Diagnosis present

## 2020-05-23 DIAGNOSIS — E11621 Type 2 diabetes mellitus with foot ulcer: Secondary | ICD-10-CM | POA: Diagnosis not present

## 2020-05-23 DIAGNOSIS — T25722D Corrosion of third degree of left foot, subsequent encounter: Secondary | ICD-10-CM | POA: Insufficient documentation
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
## 2020-06-13 ENCOUNTER — Other Ambulatory Visit: Payer: Self-pay

## 2020-06-13 ENCOUNTER — Encounter: Payer: Medicare HMO | Admitting: Physician Assistant

## 2020-06-13 DIAGNOSIS — E11621 Type 2 diabetes mellitus with foot ulcer: Secondary | ICD-10-CM | POA: Diagnosis not present
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
## 2020-07-04 ENCOUNTER — Other Ambulatory Visit: Payer: Self-pay

## 2020-07-04 ENCOUNTER — Encounter: Payer: Medicare HMO | Attending: Physician Assistant | Admitting: Physician Assistant

## 2020-07-04 DIAGNOSIS — X58XXXA Exposure to other specified factors, initial encounter: Secondary | ICD-10-CM | POA: Insufficient documentation

## 2020-07-04 DIAGNOSIS — L97521 Non-pressure chronic ulcer of other part of left foot limited to breakdown of skin: Secondary | ICD-10-CM | POA: Insufficient documentation

## 2020-07-04 DIAGNOSIS — T6591XA Toxic effect of unspecified substance, accidental (unintentional), initial encounter: Secondary | ICD-10-CM | POA: Diagnosis not present

## 2020-07-04 DIAGNOSIS — T25722D Corrosion of third degree of left foot, subsequent encounter: Secondary | ICD-10-CM | POA: Diagnosis present

## 2020-07-04 DIAGNOSIS — T25722A Corrosion of third degree of left foot, initial encounter: Secondary | ICD-10-CM | POA: Diagnosis not present

## 2020-07-04 DIAGNOSIS — T25332A Burn of third degree of left toe(s) (nail), initial encounter: Secondary | ICD-10-CM | POA: Diagnosis present

## 2020-07-04 DIAGNOSIS — E11621 Type 2 diabetes mellitus with foot ulcer: Secondary | ICD-10-CM | POA: Diagnosis not present
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
## 2020-07-11 ENCOUNTER — Other Ambulatory Visit: Payer: Self-pay

## 2020-07-11 ENCOUNTER — Encounter: Payer: Medicare HMO | Admitting: Physician Assistant

## 2020-07-11 DIAGNOSIS — T6591XA Toxic effect of unspecified substance, accidental (unintentional), initial encounter: Secondary | ICD-10-CM | POA: Diagnosis not present
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
## 2020-10-19 DIAGNOSIS — J309 Allergic rhinitis, unspecified: Secondary | ICD-10-CM | POA: Insufficient documentation

## 2021-02-05 DIAGNOSIS — I10 Essential (primary) hypertension: Secondary | ICD-10-CM | POA: Diagnosis not present

## 2021-02-05 DIAGNOSIS — E785 Hyperlipidemia, unspecified: Secondary | ICD-10-CM | POA: Diagnosis not present

## 2021-02-05 DIAGNOSIS — E119 Type 2 diabetes mellitus without complications: Secondary | ICD-10-CM | POA: Diagnosis not present

## 2021-02-08 DIAGNOSIS — E785 Hyperlipidemia, unspecified: Secondary | ICD-10-CM | POA: Diagnosis not present

## 2021-02-08 DIAGNOSIS — R7303 Prediabetes: Secondary | ICD-10-CM | POA: Diagnosis not present

## 2021-02-08 DIAGNOSIS — I10 Essential (primary) hypertension: Secondary | ICD-10-CM | POA: Diagnosis not present

## 2021-02-08 DIAGNOSIS — D649 Anemia, unspecified: Secondary | ICD-10-CM | POA: Diagnosis not present

## 2021-02-15 DIAGNOSIS — Z7984 Long term (current) use of oral hypoglycemic drugs: Secondary | ICD-10-CM | POA: Diagnosis not present

## 2021-02-15 DIAGNOSIS — Z89021 Acquired absence of right finger(s): Secondary | ICD-10-CM | POA: Diagnosis not present

## 2021-02-15 DIAGNOSIS — E119 Type 2 diabetes mellitus without complications: Secondary | ICD-10-CM | POA: Diagnosis not present

## 2021-02-15 DIAGNOSIS — Z515 Encounter for palliative care: Secondary | ICD-10-CM | POA: Diagnosis not present

## 2021-02-15 DIAGNOSIS — I10 Essential (primary) hypertension: Secondary | ICD-10-CM | POA: Diagnosis not present

## 2021-02-22 DIAGNOSIS — I10 Essential (primary) hypertension: Secondary | ICD-10-CM | POA: Diagnosis not present

## 2021-03-05 ENCOUNTER — Other Ambulatory Visit: Payer: Self-pay

## 2021-03-05 ENCOUNTER — Ambulatory Visit: Payer: Medicare HMO | Admitting: Dermatology

## 2021-03-05 DIAGNOSIS — D489 Neoplasm of uncertain behavior, unspecified: Secondary | ICD-10-CM

## 2021-03-05 DIAGNOSIS — D485 Neoplasm of uncertain behavior of skin: Secondary | ICD-10-CM

## 2021-03-05 DIAGNOSIS — L910 Hypertrophic scar: Secondary | ICD-10-CM | POA: Diagnosis not present

## 2021-03-05 NOTE — Progress Notes (Signed)
   New Patient Visit  Subjective  [name] is a 75 y.o. male who presents for the following: New Patient (Initial Visit) (Patient here concerning a spot on right posterior shoulder. He states it has been present for several years. He just noticed that it has started growing. Patient also reports a knot on left forehead. He reports it has been present for 4 - 5 years. ).  The following portions of the chart were reviewed this encounter and updated as appropriate:   Tobacco  Allergies  Meds  Problems  Med Hx  Surg Hx  Fam Hx     Review of Systems:  No other skin or systemic complaints except as noted in HPI or Assessment and Plan.  Objective  Well appearing patient in no apparent distress; mood and affect are within normal limits.  A focused examination was performed including left forehead and right superior scapula. Relevant physical exam findings are noted in the Assessment and Plan.  right superior scapula 1.1 cm pink papule   Left Forehead 0.8 cm firm non-mobile papule    Assessment & Plan  Neoplasm of uncertain behavior (2) right superior scapula  Epidermal / dermal shaving  Lesion diameter (cm):  1.1 Informed consent: discussed and consent obtained   Timeout: patient name, date of birth, surgical site, and procedure verified   Procedure prep:  Patient was prepped and draped in usual sterile fashion Prep type:  Isopropyl alcohol Anesthesia: the lesion was anesthetized in a standard fashion   Anesthetic:  1% lidocaine w/ epinephrine 1-100,000 buffered w/ 8.4% NaHCO3 Instrument used: flexible razor blade   Hemostasis achieved with: pressure, aluminum chloride and electrodesiccation   Outcome: patient tolerated procedure well   Post-procedure details: sterile dressing applied and wound care instructions given   Dressing type: bandage and petrolatum    Specimen 1 - Surgical pathology Differential Diagnosis: Growing changing lesion vs other vs keloid   Check  Margins: No  1.1 cm pink papule   Left Forehead  Growing changing lesion vs other vs keloid   Discussed if keloid higher risk of reoccurrence , will consider ILK injections.   Neoplasm at left forehead - favor bony callous - probable caused by injury.  Benign-appearing nature discussed.  Observe for now.  If change recommend evaluation by x-ray and his primary care physician.  Return for 6 - 8 week follow up on biopsy . [name], CMA, am acting as scribe for Sarina Ser, MD. Documentation: I have reviewed the above documentation for accuracy and completeness, and I agree with the above.  Sarina Ser, MD

## 2021-03-05 NOTE — Patient Instructions (Addendum)

## 2021-03-07 ENCOUNTER — Encounter: Payer: Self-pay | Admitting: Dermatology

## 2021-03-20 ENCOUNTER — Telehealth: Payer: Self-pay

## 2021-03-20 DIAGNOSIS — I10 Essential (primary) hypertension: Secondary | ICD-10-CM | POA: Diagnosis not present

## 2021-03-20 NOTE — Telephone Encounter (Signed)
-----   Message from Ralene Bathe, MD sent at 03/20/2021 12:08 PM EDT ----- Diagnosis Skin , right superior scapula KELOID  Benign keloid May persist or recur Keep follow up appt

## 2021-03-20 NOTE — Telephone Encounter (Signed)
Patient's wife informed of pathology results.  

## 2021-05-09 ENCOUNTER — Other Ambulatory Visit: Payer: Self-pay

## 2021-05-09 ENCOUNTER — Ambulatory Visit: Payer: Medicare HMO | Admitting: Dermatology

## 2021-05-09 DIAGNOSIS — L910 Hypertrophic scar: Secondary | ICD-10-CM

## 2021-05-09 NOTE — Patient Instructions (Signed)

## 2021-05-09 NOTE — Progress Notes (Signed)
   Follow-Up Visit   Subjective  [name] is a 75 y.o. male who presents for the following: recheck bx proven keloid (Of the R sup scapula - improved S/P biopsy but occasionally itchy.).  The following portions of the chart were reviewed this encounter and updated as appropriate:   Tobacco  Allergies  Meds  Problems  Med Hx  Surg Hx  Fam Hx     Review of Systems:  No other skin or systemic complaints except as noted in HPI or Assessment and Plan.  Objective  Well appearing patient in no apparent distress; mood and affect are within normal limits.  A focused examination was performed including the trunk. Relevant physical exam findings are noted in the Assessment and Plan.  R sup scapula Firm pink/brown dermal papule(s)/plaque(s). 1.1 x 0. 8 cm.      Assessment & Plan  Keloid R sup scapula Bx proven - Discussed risk of recurrence and treatment with ILK injections if bothersome.   Intralesional injection - R sup scapula Location: R sup scapula   Informed Consent: Discussed risks (infection, pain, bleeding, bruising, thinning of the skin, loss of skin pigment, lack of resolution, and recurrence of lesion) and benefits of the procedure, as well as the alternatives. Informed consent was obtained. Preparation: The area was prepared a standard fashion.  Procedure Details: An intralesional injection was performed with Kenalog 5 mg/cc. 0.1 cc in total were injected.  Total number of injections: 2  NDC # 7494-4967-59  Plan: The patient was instructed on post-op care. Recommend OTC analgesia as needed for pain.  Return for keloid follow up in 6-8 weeks S/P ILK.  I, Rudell Cobb, CMA, am acting as scribe for Sarina Ser, MD . Documentation: I have reviewed the above documentation for accuracy and completeness, and I agree with the above.  Sarina Ser, MD

## 2021-05-11 ENCOUNTER — Encounter: Payer: Self-pay | Admitting: Dermatology

## 2021-06-11 DIAGNOSIS — D509 Iron deficiency anemia, unspecified: Secondary | ICD-10-CM | POA: Diagnosis not present

## 2021-06-11 DIAGNOSIS — I10 Essential (primary) hypertension: Secondary | ICD-10-CM | POA: Diagnosis not present

## 2021-06-11 DIAGNOSIS — E119 Type 2 diabetes mellitus without complications: Secondary | ICD-10-CM | POA: Diagnosis not present

## 2021-06-11 DIAGNOSIS — R7303 Prediabetes: Secondary | ICD-10-CM | POA: Diagnosis not present

## 2021-06-11 DIAGNOSIS — E785 Hyperlipidemia, unspecified: Secondary | ICD-10-CM | POA: Diagnosis not present

## 2021-06-25 DIAGNOSIS — D649 Anemia, unspecified: Secondary | ICD-10-CM | POA: Diagnosis not present

## 2021-06-25 DIAGNOSIS — E6609 Other obesity due to excess calories: Secondary | ICD-10-CM | POA: Diagnosis not present

## 2021-06-25 DIAGNOSIS — E785 Hyperlipidemia, unspecified: Secondary | ICD-10-CM | POA: Diagnosis not present

## 2021-06-25 DIAGNOSIS — I10 Essential (primary) hypertension: Secondary | ICD-10-CM | POA: Diagnosis not present

## 2021-06-25 DIAGNOSIS — R7303 Prediabetes: Secondary | ICD-10-CM | POA: Diagnosis not present

## 2021-06-27 ENCOUNTER — Ambulatory Visit: Payer: Medicare HMO | Admitting: Dermatology

## 2021-10-18 DIAGNOSIS — E785 Hyperlipidemia, unspecified: Secondary | ICD-10-CM | POA: Diagnosis not present

## 2021-10-18 DIAGNOSIS — I10 Essential (primary) hypertension: Secondary | ICD-10-CM | POA: Diagnosis not present

## 2021-10-18 DIAGNOSIS — R7303 Prediabetes: Secondary | ICD-10-CM | POA: Diagnosis not present

## 2021-10-18 DIAGNOSIS — D649 Anemia, unspecified: Secondary | ICD-10-CM | POA: Diagnosis not present

## 2021-10-23 DIAGNOSIS — D649 Anemia, unspecified: Secondary | ICD-10-CM | POA: Diagnosis not present

## 2021-10-23 DIAGNOSIS — E785 Hyperlipidemia, unspecified: Secondary | ICD-10-CM | POA: Diagnosis not present

## 2021-10-23 DIAGNOSIS — R7303 Prediabetes: Secondary | ICD-10-CM | POA: Diagnosis not present

## 2021-10-23 DIAGNOSIS — E782 Mixed hyperlipidemia: Secondary | ICD-10-CM | POA: Diagnosis not present

## 2021-10-23 DIAGNOSIS — I10 Essential (primary) hypertension: Secondary | ICD-10-CM | POA: Diagnosis not present

## 2021-10-23 DIAGNOSIS — E6609 Other obesity due to excess calories: Secondary | ICD-10-CM | POA: Diagnosis not present

## 2021-10-23 DIAGNOSIS — E1165 Type 2 diabetes mellitus with hyperglycemia: Secondary | ICD-10-CM | POA: Diagnosis not present

## 2021-10-31 ENCOUNTER — Other Ambulatory Visit: Payer: Self-pay | Admitting: *Deleted

## 2021-10-31 DIAGNOSIS — D649 Anemia, unspecified: Secondary | ICD-10-CM

## 2021-11-04 NOTE — Progress Notes (Signed)
?[place]  ?[phone]) [phone] [phone] ? ?ID: Avie Arenas OB: 21-Dec-1945  MR#: [id number]  IPJ#:[id number] ? ?Patient Care Team: ?Perrin Maltese, MD as PCP - General (Internal Medicine) ?Lloyd Huger, MD as Consulting Physician (Oncology) ? ? ?CHIEF COMPLAINT: Mild iron deficiency anemia. ? ?INTERVAL HISTORY: Patient last seen in clinic in May 2021 and was referred back for persistent iron deficiency anemia despite increasing oral iron supplementation.  Patient has mild weakness and fatigue, but otherwise feels well.  He has a good appetite and denies weight loss.  He has no neurologic complaints.  He denies any recent fevers or illnesses.  He has no chest pain, shortness of breath, cough, or hemoptysis.  He denies any nausea, vomiting, constipation, or diarrhea.  He has no melena or hematochezia.  He has no urinary complaints.  Patient offers no further specific complaints today. ? ?REVIEW OF SYSTEMS:   ?Review of Systems  ?Constitutional:  Positive for malaise/fatigue. Negative for fever and weight loss.  ?Respiratory: Negative.  Negative for cough, hemoptysis and shortness of breath.   ?Cardiovascular: Negative.  Negative for chest pain and leg swelling.  ?Gastrointestinal: Negative.  Negative for abdominal pain, blood in stool and melena.  ?Genitourinary: Negative.  Negative for hematuria.  ?Musculoskeletal: Negative.  Negative for back pain.  ?Skin: Negative.  Negative for rash.  ?Neurological:  Positive for weakness. Negative for dizziness, focal weakness and headaches.  ?Psychiatric/Behavioral: Negative.  The patient is not nervous/anxious.   ? ?As per HPI. Otherwise, a complete review of systems is negative. ? ?PAST MEDICAL HISTORY: ?Past Medical History:  ?Diagnosis Date  ? Congestive cardiomyopathy ([place])   ? Hypertension   ? ? ?PAST SURGICAL HISTORY: ?No past surgical history on file. ? ?FAMILY HISTORY: ?No family history on file. ? ?ADVANCED DIRECTIVES (Y/N):   N ? ?HEALTH MAINTENANCE: ?Social History  ? ?Tobacco Use  ? Smoking status: Never  ? Smokeless tobacco: Never  ? Tobacco comments:  ?  Tobacco use-no  ?Vaping Use  ? Vaping Use: Never used  ?Substance Use Topics  ? Alcohol use: Yes  ?  Comment: Occasional  ? ? ? Colonoscopy: ? PAP: ? Bone density: ? Lipid panel: ? ?Allergies  ?Allergen Reactions  ? Norvasc [Amlodipine]   ?  Note: Swelling in feet  ? ? ?Current Outpatient Medications  ?Medication Sig Dispense Refill  ? aspirin 81 MG EC tablet Take 81 mg by mouth daily.      ? atorvastatin (LIPITOR) 10 MG tablet Take 10 mg by mouth daily.    ? cloNIDine (CATAPRES) 0.3 MG tablet Take 0.3 mg by mouth 2 (two) times daily.    ? furosemide (LASIX) 40 MG tablet Take 40 mg by mouth daily.    ? hydrALAZINE (APRESOLINE) 100 MG tablet Take 100 mg by mouth 3 (three) times daily.    ? losartan-hydrochlorothiazide (HYZAAR) 100-25 MG tablet Take 1 tablet by mouth daily.    ? metFORMIN (GLUCOPHAGE) 500 MG tablet Take 500 mg by mouth 2 (two) times daily with a meal.    ? metoprolol succinate (TOPROL-XL) 50 MG 24 hr tablet Take 50 mg by mouth daily.    ? metoprolol tartrate (LOPRESSOR) 100 MG tablet Take 100 mg by mouth daily.    ? potassium chloride (KLOR-CON) 10 MEQ tablet Potassium Chloride Crys ER 10 MEQ Oral Tablet Extended Release QTY: 90 tablet Days: 90 Refills: 1  Written: 04/23/21 Patient Instructions: TAKE 1 TABLET EVERY MORNING WITH FUROSEMIDE    ?  Vitamin D, Ergocalciferol, (DRISDOL) 1.25 MG (50000 UNIT) CAPS capsule Take 1 capsule by mouth every 7 (seven) days.     ? amLODipine (NORVASC) 10 MG tablet Take 10 mg by mouth daily.   (Patient not taking: Reported on 11/08/2021)    ? ferrous sulfate 325 (65 FE) MG tablet Take 975 mg by mouth daily with breakfast.  (Patient not taking: Reported on 11/08/2021)    ? fluticasone (FLONASE) 50 MCG/ACT nasal spray Fluticasone Propionate 50 MCG/ACT Nasal Suspension QTY: 1 bottle Days: 30 Refills: 3  Written: 09/02/18 Patient  Instructions: Bend head forward, instill 1 spray in each nostril daily (Patient not taking: Reported on 11/08/2021)    ? potassium chloride SA (KLOR-CON) 20 MEQ tablet Take 20 mEq by mouth daily. (Patient not taking: Reported on 11/08/2021)    ? ?No current facility-administered medications for this visit.  ? ? ?OBJECTIVE: ?Vitals:  ? 11/08/21 0955  ?BP: (!) 182/97  ?Pulse: 68  ?Resp: 18  ?Temp: 98.5 ?F (36.9 ?C)  ?SpO2: 98%  ?   There is no height or weight on file to calculate BMI.    ECOG FS:0 - Asymptomatic ? ?General: Well-developed, well-nourished, no acute distress. ?Eyes: Pink conjunctiva, anicteric sclera. ?HEENT: Normocephalic, moist mucous membranes. ?Lungs: No audible wheezing or coughing. ?Heart: Regular rate and rhythm. ?Abdomen: Soft, nontender, no obvious distention. ?Musculoskeletal: No edema, cyanosis, or clubbing. ?Neuro: Alert, answering all questions appropriately. Cranial nerves grossly intact. ?Skin: No rashes or petechiae noted. ?Psych: Normal affect. ? ? ?LAB RESULTS: ? ?No results found for: NA, K, CL, CO2, GLUCOSE, BUN, CREATININE, CALCIUM, PROT, ALBUMIN, AST, ALT, ALKPHOS, BILITOT, GFRNONAA, GFRAA ? ?Lab Results  ?Component Value Date  ? WBC 7.6 11/08/2021  ? NEUTROABS 4.1 11/08/2021  ? HGB 12.3 (L) 11/08/2021  ? HCT 36.2 (L) 11/08/2021  ? MCV 91.4 11/08/2021  ? PLT 203 11/08/2021  ? ?Lab Results  ?Component Value Date  ? IRON 71 11/08/2021  ? TIBC 273 11/08/2021  ? IRONPCTSAT 26 11/08/2021  ? ?Lab Results  ?Component Value Date  ? FERRITIN 321 11/08/2021  ? ? ? ? ?STUDIES: ?No results found. ? ?ASSESSMENT: Mild iron deficiency anemia.   ? ?PLAN:   ? ?1. Mild iron deficiency anemia: Patient's hemoglobin remains mildly decreased at 12.3 despite increasing oral iron supplementation.  Previously, full anemia work-up did not reveal any significant etiology.  All of his other laboratory work including is either negative or within normal limits.  Patient had luminal evaluation in August 2020 that  did not reveal any significant pathology.  Although patient's iron stores are within normal limits, we will proceed with 3 doses of 200 mg IV Venofer.  No further intervention is needed at this time.  Return to clinic in 3 months with repeat laboratory work and further evaluation.   ?2.  Hypertension: Chronic and unchanged.  Continue monitoring and treatment per primary care. ? ?I spent a total of 30 minutes reviewing chart data, face-to-face evaluation with the patient, counseling and coordination of care as detailed above. ? ? ? ?Patient expressed understanding and was in agreement with this plan. He also understands that He can call clinic at any time with any questions, concerns, or complaints.  ? ? ?Lloyd Huger, MD   11/09/2021 6:30 AM ? ? ? ? ?

## 2021-11-08 ENCOUNTER — Other Ambulatory Visit: Payer: Self-pay

## 2021-11-08 ENCOUNTER — Encounter: Payer: Self-pay | Admitting: Oncology

## 2021-11-08 ENCOUNTER — Inpatient Hospital Stay: Payer: Medicare HMO | Attending: Oncology | Admitting: Oncology

## 2021-11-08 ENCOUNTER — Inpatient Hospital Stay: Payer: Medicare HMO

## 2021-11-08 VITALS — BP 182/97 | HR 68 | Temp 98.5°F | Resp 18 | Wt 267.0 lb

## 2021-11-08 DIAGNOSIS — I10 Essential (primary) hypertension: Secondary | ICD-10-CM | POA: Insufficient documentation

## 2021-11-08 DIAGNOSIS — H33311 Horseshoe tear of retina without detachment, right eye: Secondary | ICD-10-CM | POA: Insufficient documentation

## 2021-11-08 DIAGNOSIS — D649 Anemia, unspecified: Secondary | ICD-10-CM | POA: Diagnosis not present

## 2021-11-08 DIAGNOSIS — Z79899 Other long term (current) drug therapy: Secondary | ICD-10-CM | POA: Diagnosis not present

## 2021-11-08 DIAGNOSIS — R5383 Other fatigue: Secondary | ICD-10-CM | POA: Insufficient documentation

## 2021-11-08 DIAGNOSIS — B351 Tinea unguium: Secondary | ICD-10-CM | POA: Insufficient documentation

## 2021-11-08 DIAGNOSIS — L600 Ingrowing nail: Secondary | ICD-10-CM | POA: Insufficient documentation

## 2021-11-08 DIAGNOSIS — D509 Iron deficiency anemia, unspecified: Secondary | ICD-10-CM | POA: Diagnosis not present

## 2021-11-08 DIAGNOSIS — D179 Benign lipomatous neoplasm, unspecified: Secondary | ICD-10-CM | POA: Insufficient documentation

## 2021-11-08 DIAGNOSIS — IMO0001 Reserved for inherently not codable concepts without codable children: Secondary | ICD-10-CM | POA: Insufficient documentation

## 2021-11-08 DIAGNOSIS — R531 Weakness: Secondary | ICD-10-CM | POA: Insufficient documentation

## 2021-11-08 DIAGNOSIS — I420 Dilated cardiomyopathy: Secondary | ICD-10-CM | POA: Insufficient documentation

## 2021-11-08 DIAGNOSIS — B353 Tinea pedis: Secondary | ICD-10-CM | POA: Insufficient documentation

## 2021-11-08 DIAGNOSIS — M722 Plantar fascial fibromatosis: Secondary | ICD-10-CM | POA: Insufficient documentation

## 2021-11-08 DIAGNOSIS — R609 Edema, unspecified: Secondary | ICD-10-CM | POA: Insufficient documentation

## 2021-11-08 LAB — CBC WITH DIFFERENTIAL/PLATELET
Abs Immature Granulocytes: 0.04 10*3/uL (ref 0.00–0.07)
Basophils Absolute: 0 10*3/uL (ref 0.0–0.1)
Basophils Relative: 0 %
Eosinophils Absolute: 0.4 10*3/uL (ref 0.0–0.5)
Eosinophils Relative: 5 %
HCT: 36.2 % — ABNORMAL LOW (ref 39.0–52.0)
Hemoglobin: 12.3 g/dL — ABNORMAL LOW (ref 13.0–17.0)
Immature Granulocytes: 1 %
Lymphocytes Relative: 31 %
Lymphs Abs: 2.4 10*3/uL (ref 0.7–4.0)
MCH: 31.1 pg (ref 26.0–34.0)
MCHC: 34 g/dL (ref 30.0–36.0)
MCV: 91.4 fL (ref 80.0–100.0)
Monocytes Absolute: 0.7 10*3/uL (ref 0.1–1.0)
Monocytes Relative: 9 %
Neutro Abs: 4.1 10*3/uL (ref 1.7–7.7)
Neutrophils Relative %: 54 %
Platelets: 203 10*3/uL (ref 150–400)
RBC: 3.96 MIL/uL — ABNORMAL LOW (ref 4.22–5.81)
RDW: 12.7 % (ref 11.5–15.5)
WBC: 7.6 10*3/uL (ref 4.0–10.5)
nRBC: 0 % (ref 0.0–0.2)

## 2021-11-08 LAB — IRON AND TIBC
Iron: 71 ug/dL (ref 45–182)
Saturation Ratios: 26 % (ref 17.9–39.5)
TIBC: 273 ug/dL (ref 250–450)
UIBC: 202 ug/dL

## 2021-11-08 LAB — FERRITIN: Ferritin: 321 ng/mL (ref 24–336)

## 2021-11-08 NOTE — Progress Notes (Signed)
Pt states he is more tired than usual lately. ? ?

## 2021-11-09 ENCOUNTER — Encounter: Payer: Self-pay | Admitting: Oncology

## 2021-11-14 ENCOUNTER — Inpatient Hospital Stay: Payer: Medicare HMO

## 2021-11-14 VITALS — BP 126/82 | HR 57 | Temp 97.2°F | Resp 20

## 2021-11-14 DIAGNOSIS — R531 Weakness: Secondary | ICD-10-CM | POA: Diagnosis not present

## 2021-11-14 DIAGNOSIS — I10 Essential (primary) hypertension: Secondary | ICD-10-CM | POA: Diagnosis not present

## 2021-11-14 DIAGNOSIS — D509 Iron deficiency anemia, unspecified: Secondary | ICD-10-CM | POA: Diagnosis not present

## 2021-11-14 DIAGNOSIS — I420 Dilated cardiomyopathy: Secondary | ICD-10-CM | POA: Diagnosis not present

## 2021-11-14 DIAGNOSIS — Z79899 Other long term (current) drug therapy: Secondary | ICD-10-CM | POA: Diagnosis not present

## 2021-11-14 DIAGNOSIS — R5383 Other fatigue: Secondary | ICD-10-CM | POA: Diagnosis not present

## 2021-11-14 MED ORDER — SODIUM CHLORIDE 0.9 % IV SOLN: 200.0000 mg | Freq: Once | INTRAVENOUS | Status: DC

## 2021-11-14 MED ORDER — SODIUM CHLORIDE 0.9 % IV SOLN
Freq: Once | INTRAVENOUS | Status: AC
  Filled 2021-11-14: qty 250

## 2021-11-14 MED ORDER — IRON SUCROSE 20 MG/ML IV SOLN
200.0000 mg | Freq: Once | INTRAVENOUS | Status: AC
  Administered 2021-11-14: 200 mg via INTRAVENOUS
  Filled 2021-11-14: qty 10

## 2021-11-14 NOTE — Patient Instructions (Signed)

## 2021-11-16 ENCOUNTER — Inpatient Hospital Stay: Payer: Medicare HMO

## 2021-11-16 ENCOUNTER — Ambulatory Visit: Payer: Medicare HMO

## 2021-11-16 VITALS — BP 145/74 | HR 60 | Temp 97.0°F | Resp 19

## 2021-11-16 DIAGNOSIS — I420 Dilated cardiomyopathy: Secondary | ICD-10-CM | POA: Diagnosis not present

## 2021-11-16 DIAGNOSIS — D509 Iron deficiency anemia, unspecified: Secondary | ICD-10-CM

## 2021-11-16 DIAGNOSIS — Z79899 Other long term (current) drug therapy: Secondary | ICD-10-CM | POA: Diagnosis not present

## 2021-11-16 DIAGNOSIS — I10 Essential (primary) hypertension: Secondary | ICD-10-CM | POA: Diagnosis not present

## 2021-11-16 DIAGNOSIS — R5383 Other fatigue: Secondary | ICD-10-CM | POA: Diagnosis not present

## 2021-11-16 DIAGNOSIS — R531 Weakness: Secondary | ICD-10-CM | POA: Diagnosis not present

## 2021-11-16 MED ORDER — SODIUM CHLORIDE 0.9 % IV SOLN: 200.0000 mg | Freq: Once | INTRAVENOUS | Status: DC

## 2021-11-16 MED ORDER — IRON SUCROSE 20 MG/ML IV SOLN
200.0000 mg | Freq: Once | INTRAVENOUS | Status: AC
  Administered 2021-11-16: 200 mg via INTRAVENOUS
  Filled 2021-11-16: qty 10

## 2021-11-16 MED ORDER — SODIUM CHLORIDE 0.9 % IV SOLN
Freq: Once | INTRAVENOUS | Status: AC
  Filled 2021-11-16: qty 250

## 2021-11-16 NOTE — Patient Instructions (Signed)

## 2021-11-20 ENCOUNTER — Inpatient Hospital Stay: Payer: Medicare HMO | Attending: Oncology

## 2021-11-20 VITALS — BP 145/81 | HR 72 | Temp 97.4°F | Resp 16

## 2021-11-20 DIAGNOSIS — D509 Iron deficiency anemia, unspecified: Secondary | ICD-10-CM | POA: Insufficient documentation

## 2021-11-20 MED ORDER — SODIUM CHLORIDE 0.9 % IV SOLN
Freq: Once | INTRAVENOUS | Status: AC
  Filled 2021-11-20: qty 250

## 2021-11-20 MED ORDER — IRON SUCROSE 20 MG/ML IV SOLN
200.0000 mg | Freq: Once | INTRAVENOUS | Status: AC
  Administered 2021-11-20: 200 mg via INTRAVENOUS
  Filled 2021-11-20: qty 10

## 2021-11-20 MED ORDER — SODIUM CHLORIDE 0.9 % IV SOLN: 200.0000 mg | Freq: Once | INTRAVENOUS | Status: DC

## 2021-11-20 NOTE — Patient Instructions (Signed)

## 2022-01-31 DIAGNOSIS — M79641 Pain in right hand: Secondary | ICD-10-CM | POA: Diagnosis not present

## 2022-01-31 DIAGNOSIS — S66911A Strain of unspecified muscle, fascia and tendon at wrist and hand level, right hand, initial encounter: Secondary | ICD-10-CM | POA: Diagnosis not present

## 2022-01-31 DIAGNOSIS — S29019A Strain of muscle and tendon of unspecified wall of thorax, initial encounter: Secondary | ICD-10-CM | POA: Diagnosis not present

## 2022-02-01 NOTE — Progress Notes (Signed)
[facility]  Telephone:(336) [phone] Fax:(336) [phone]  ID: [name] OB: [birth year]  MR#: [id number]  QIH#:[id number]  Patient Care Team: Perrin Maltese, MD as PCP - General (Internal Medicine) Lloyd Huger, MD as Consulting Physician (Oncology)   CHIEF COMPLAINT: Mild iron deficiency anemia.  INTERVAL HISTORY: Patient returns to clinic today for repeat laboratory work, further evaluation, and consideration of additional IV Venofer.  He currently feels well and is asymptomatic.  He does not complain of any weakness or fatigue today.  He has a good appetite and denies weight loss.  He has no neurologic complaints.  He denies any recent fevers or illnesses.  He has no chest pain, shortness of breath, cough, or hemoptysis.  He denies any nausea, vomiting, constipation, or diarrhea.  He has no melena or hematochezia.  He has no urinary complaints.  Patient offers no specific complaints today.  REVIEW OF SYSTEMS:   Review of Systems  Constitutional: Negative.  Negative for fever, malaise/fatigue and weight loss.  Respiratory: Negative.  Negative for cough, hemoptysis and shortness of breath.   Cardiovascular: Negative.  Negative for chest pain and leg swelling.  Gastrointestinal: Negative.  Negative for abdominal pain, blood in stool and melena.  Genitourinary: Negative.  Negative for hematuria.  Musculoskeletal: Negative.  Negative for back pain.  Skin: Negative.  Negative for rash.  Neurological: Negative.  Negative for dizziness, focal weakness, weakness and headaches.  Psychiatric/Behavioral: Negative.  The patient is not nervous/anxious.     As per HPI. Otherwise, a complete review of systems is negative.  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    LIPOMA EXCISION Left 2008   neck    FAMILY HISTORY: Family History  Problem Relation Age of Onset   Hypertension Mother    Cancer Father 48   Asthma Sister     ADVANCED DIRECTIVES (Y/N):  N  HEALTH MAINTENANCE: Social History   Tobacco Use   Smoking status: Never   Smokeless tobacco: Never   Tobacco comments:    Tobacco use-no  Vaping Use   Vaping Use: Never used  Substance Use Topics   Alcohol use: Yes    Comment: Occasional     Colonoscopy:  PAP:  Bone density:  Lipid panel:  Allergies  Allergen Reactions   Norvasc [Amlodipine]     Note: Swelling in feet    Current Outpatient Medications  Medication Sig Dispense Refill   aspirin 81 MG EC tablet Take 81 mg by mouth daily.       atorvastatin (LIPITOR) 10 MG tablet Take 10 mg by mouth daily.     cloNIDine (CATAPRES) 0.3 MG tablet Take 0.3 mg by mouth 2 (two) times daily.     furosemide (LASIX) 40 MG tablet Take 40 mg by mouth daily.     hydrALAZINE (APRESOLINE) 100 MG tablet Take 100 mg by mouth 3 (three) times daily.     losartan-hydrochlorothiazide (HYZAAR) 100-25 MG tablet Take 1 tablet by mouth daily.     metFORMIN (GLUCOPHAGE) 500 MG tablet Take 500 mg by mouth 2 (two) times daily with a meal.     metoprolol succinate (TOPROL-XL) 50 MG 24 hr tablet Take 50 mg by mouth daily.     metoprolol tartrate (LOPRESSOR) 100 MG tablet Take 100 mg by  mouth daily.     potassium chloride (KLOR-CON) 10 MEQ tablet Potassium Chloride Crys ER 10 MEQ Oral Tablet Extended Release QTY: 90 tablet Days: 90 Refills: 1  Written: 04/23/21 Patient Instructions: TAKE 1 TABLET EVERY MORNING WITH FUROSEMIDE     Vitamin D, Ergocalciferol, (DRISDOL) 1.25 MG (50000 UNIT) CAPS capsule Take 1 capsule by mouth every 7 (seven) days.      amLODipine (NORVASC) 10 MG tablet Take 10 mg by mouth daily.   (Patient not taking: Reported on 11/08/2021)     ferrous sulfate 325 (65 FE) MG tablet Take 975 mg by mouth daily with breakfast.  (Patient not  taking: Reported on 11/08/2021)     fluticasone (FLONASE) 50 MCG/ACT nasal spray Fluticasone Propionate 50 MCG/ACT Nasal Suspension QTY: 1 bottle Days: 30 Refills: 3  Written: 09/02/18 Patient Instructions: Bend head forward, instill 1 spray in each nostril daily (Patient not taking: Reported on 11/08/2021)     potassium chloride SA (KLOR-CON) 20 MEQ tablet Take 20 mEq by mouth daily. (Patient not taking: Reported on 11/08/2021)     No current facility-administered medications for this visit.    OBJECTIVE: Vitals:   02/07/22 1416  BP: 128/80  Pulse: 62  Temp: (!) 96.9 F (36.1 C)  SpO2: 96%     Body mass index is 37.15 kg/m.    ECOG FS:0 - Asymptomatic  General: Well-developed, well-nourished, no acute distress. Eyes: Pink conjunctiva, anicteric sclera. HEENT: Normocephalic, moist mucous membranes. Lungs: No audible wheezing or coughing. Heart: Regular rate and rhythm. Abdomen: Soft, nontender, no obvious distention. Musculoskeletal: No edema, cyanosis, or clubbing. Neuro: Alert, answering all questions appropriately. Cranial nerves grossly intact. Skin: No rashes or petechiae noted. Psych: Normal affect.  LAB RESULTS:  No results found for: "NA", "K", "CL", "CO2", "GLUCOSE", "BUN", "CREATININE", "CALCIUM", "PROT", "ALBUMIN", "AST", "ALT", "ALKPHOS", "BILITOT", "GFRNONAA", "GFRAA"  Lab Results  Component Value Date   WBC 7.1 02/06/2022   NEUTROABS 4.6 02/06/2022   HGB 12.8 (L) 02/06/2022   HCT 36.9 (L) 02/06/2022   MCV 91.1 02/06/2022   PLT 192 02/06/2022   Lab Results  Component Value Date   IRON 90 02/06/2022   TIBC 255 02/06/2022   IRONPCTSAT 35 02/06/2022   Lab Results  Component Value Date   FERRITIN 364 (H) 02/06/2022      STUDIES: No results found.  ASSESSMENT: Mild iron deficiency anemia.    PLAN:    1. Mild iron deficiency anemia: Patient's hemoglobin has improved to 12.8 and his iron stores are now within normal limits.  Previously, all of his  other laboratory work including is either negative or within normal limits.  Patient had luminal evaluation in August 2020 that did not reveal any significant pathology.  Patient last received IV iron on November 20, 2021.  He does not require additional treatment today.  Return to clinic in 4 months with repeat laboratory work, further evaluation, and consideration of additional treatment if needed.   2.  Hypertension: Resolved.  Patient blood pressure is now within normal limits.   I spent a total of 20 minutes reviewing chart data, face-to-face evaluation with the patient, counseling and coordination of care as detailed above.    Patient expressed understanding and was in agreement with this plan. He also understands that He can call clinic at any time with any questions, concerns, or complaints.    Lloyd Huger, MD   02/07/2022 5:11 PM

## 2022-02-05 ENCOUNTER — Other Ambulatory Visit: Payer: Self-pay

## 2022-02-05 DIAGNOSIS — D509 Iron deficiency anemia, unspecified: Secondary | ICD-10-CM

## 2022-02-06 ENCOUNTER — Inpatient Hospital Stay: Payer: Medicare HMO | Attending: Oncology

## 2022-02-06 DIAGNOSIS — D509 Iron deficiency anemia, unspecified: Secondary | ICD-10-CM | POA: Diagnosis not present

## 2022-02-06 LAB — CBC WITH DIFFERENTIAL/PLATELET
Abs Immature Granulocytes: 0.02 10*3/uL (ref 0.00–0.07)
Basophils Absolute: 0 10*3/uL (ref 0.0–0.1)
Basophils Relative: 0 %
Eosinophils Absolute: 0.2 10*3/uL (ref 0.0–0.5)
Eosinophils Relative: 3 %
HCT: 36.9 % — ABNORMAL LOW (ref 39.0–52.0)
Hemoglobin: 12.8 g/dL — ABNORMAL LOW (ref 13.0–17.0)
Immature Granulocytes: 0 %
Lymphocytes Relative: 23 %
Lymphs Abs: 1.6 10*3/uL (ref 0.7–4.0)
MCH: 31.6 pg (ref 26.0–34.0)
MCHC: 34.7 g/dL (ref 30.0–36.0)
MCV: 91.1 fL (ref 80.0–100.0)
Monocytes Absolute: 0.6 10*3/uL (ref 0.1–1.0)
Monocytes Relative: 8 %
Neutro Abs: 4.6 10*3/uL (ref 1.7–7.7)
Neutrophils Relative %: 66 %
Platelets: 192 10*3/uL (ref 150–400)
RBC: 4.05 MIL/uL — ABNORMAL LOW (ref 4.22–5.81)
RDW: 12.6 % (ref 11.5–15.5)
WBC: 7.1 10*3/uL (ref 4.0–10.5)
nRBC: 0 % (ref 0.0–0.2)

## 2022-02-06 LAB — IRON AND TIBC
Iron: 90 ug/dL (ref 45–182)
Saturation Ratios: 35 % (ref 17.9–39.5)
TIBC: 255 ug/dL (ref 250–450)
UIBC: 165 ug/dL

## 2022-02-06 LAB — FERRITIN: Ferritin: 364 ng/mL — ABNORMAL HIGH (ref 24–336)

## 2022-02-07 ENCOUNTER — Encounter: Payer: Self-pay | Admitting: Oncology

## 2022-02-07 ENCOUNTER — Inpatient Hospital Stay: Payer: Medicare HMO | Admitting: Oncology

## 2022-02-07 ENCOUNTER — Inpatient Hospital Stay: Payer: Medicare HMO

## 2022-02-07 VITALS — BP 128/80 | HR 62 | Temp 96.9°F | Ht 70.0 in | Wt 258.9 lb

## 2022-02-07 DIAGNOSIS — D509 Iron deficiency anemia, unspecified: Secondary | ICD-10-CM

## 2022-02-18 DIAGNOSIS — E785 Hyperlipidemia, unspecified: Secondary | ICD-10-CM | POA: Diagnosis not present

## 2022-02-18 DIAGNOSIS — I10 Essential (primary) hypertension: Secondary | ICD-10-CM | POA: Diagnosis not present

## 2022-02-18 DIAGNOSIS — D649 Anemia, unspecified: Secondary | ICD-10-CM | POA: Diagnosis not present

## 2022-02-18 DIAGNOSIS — R7303 Prediabetes: Secondary | ICD-10-CM | POA: Diagnosis not present

## 2022-02-21 DIAGNOSIS — E1122 Type 2 diabetes mellitus with diabetic chronic kidney disease: Secondary | ICD-10-CM | POA: Diagnosis not present

## 2022-02-21 DIAGNOSIS — E785 Hyperlipidemia, unspecified: Secondary | ICD-10-CM | POA: Diagnosis not present

## 2022-02-21 DIAGNOSIS — M1990 Unspecified osteoarthritis, unspecified site: Secondary | ICD-10-CM | POA: Diagnosis not present

## 2022-02-21 DIAGNOSIS — E782 Mixed hyperlipidemia: Secondary | ICD-10-CM | POA: Diagnosis not present

## 2022-02-21 DIAGNOSIS — E1165 Type 2 diabetes mellitus with hyperglycemia: Secondary | ICD-10-CM | POA: Diagnosis not present

## 2022-02-21 DIAGNOSIS — I10 Essential (primary) hypertension: Secondary | ICD-10-CM | POA: Diagnosis not present

## 2022-03-28 DIAGNOSIS — M545 Low back pain, unspecified: Secondary | ICD-10-CM | POA: Diagnosis not present

## 2022-03-28 DIAGNOSIS — I10 Essential (primary) hypertension: Secondary | ICD-10-CM | POA: Diagnosis not present

## 2022-03-28 DIAGNOSIS — E1165 Type 2 diabetes mellitus with hyperglycemia: Secondary | ICD-10-CM | POA: Diagnosis not present

## 2022-03-28 DIAGNOSIS — M1990 Unspecified osteoarthritis, unspecified site: Secondary | ICD-10-CM | POA: Diagnosis not present

## 2022-04-04 DIAGNOSIS — E1165 Type 2 diabetes mellitus with hyperglycemia: Secondary | ICD-10-CM | POA: Diagnosis not present

## 2022-04-04 DIAGNOSIS — I10 Essential (primary) hypertension: Secondary | ICD-10-CM | POA: Diagnosis not present

## 2022-04-04 DIAGNOSIS — M1990 Unspecified osteoarthritis, unspecified site: Secondary | ICD-10-CM | POA: Diagnosis not present

## 2022-04-04 DIAGNOSIS — R5383 Other fatigue: Secondary | ICD-10-CM | POA: Diagnosis not present

## 2022-04-04 DIAGNOSIS — M5441 Lumbago with sciatica, right side: Secondary | ICD-10-CM | POA: Diagnosis not present

## 2022-04-08 DIAGNOSIS — H524 Presbyopia: Secondary | ICD-10-CM | POA: Diagnosis not present

## 2022-06-12 ENCOUNTER — Inpatient Hospital Stay: Payer: Medicare HMO | Attending: Oncology

## 2022-06-12 DIAGNOSIS — Z79899 Other long term (current) drug therapy: Secondary | ICD-10-CM | POA: Insufficient documentation

## 2022-06-12 DIAGNOSIS — I10 Essential (primary) hypertension: Secondary | ICD-10-CM | POA: Diagnosis not present

## 2022-06-12 DIAGNOSIS — D509 Iron deficiency anemia, unspecified: Secondary | ICD-10-CM | POA: Insufficient documentation

## 2022-06-12 LAB — CBC WITH DIFFERENTIAL/PLATELET
Abs Immature Granulocytes: 0.03 10*3/uL (ref 0.00–0.07)
Basophils Absolute: 0 10*3/uL (ref 0.0–0.1)
Basophils Relative: 0 %
Eosinophils Absolute: 0.3 10*3/uL (ref 0.0–0.5)
Eosinophils Relative: 4 %
HCT: 36.9 % — ABNORMAL LOW (ref 39.0–52.0)
Hemoglobin: 12.5 g/dL — ABNORMAL LOW (ref 13.0–17.0)
Immature Granulocytes: 0 %
Lymphocytes Relative: 22 %
Lymphs Abs: 1.5 10*3/uL (ref 0.7–4.0)
MCH: 30.5 pg (ref 26.0–34.0)
MCHC: 33.9 g/dL (ref 30.0–36.0)
MCV: 90 fL (ref 80.0–100.0)
Monocytes Absolute: 0.5 10*3/uL (ref 0.1–1.0)
Monocytes Relative: 7 %
Neutro Abs: 4.7 10*3/uL (ref 1.7–7.7)
Neutrophils Relative %: 67 %
Platelets: 203 10*3/uL (ref 150–400)
RBC: 4.1 MIL/uL — ABNORMAL LOW (ref 4.22–5.81)
RDW: 12.4 % (ref 11.5–15.5)
WBC: 7 10*3/uL (ref 4.0–10.5)
nRBC: 0 % (ref 0.0–0.2)

## 2022-06-12 LAB — IRON AND TIBC
Iron: 71 ug/dL (ref 45–182)
Saturation Ratios: 26 % (ref 17.9–39.5)
TIBC: 274 ug/dL (ref 250–450)
UIBC: 203 ug/dL

## 2022-06-12 LAB — FERRITIN: Ferritin: 346 ng/mL — ABNORMAL HIGH (ref 24–336)

## 2022-06-12 MED FILL — Iron Sucrose Inj 20 MG/ML (Fe Equiv): INTRAVENOUS | Qty: 10 | Status: AC

## 2022-06-13 ENCOUNTER — Encounter: Payer: Self-pay | Admitting: Oncology

## 2022-06-13 ENCOUNTER — Inpatient Hospital Stay: Payer: Medicare HMO

## 2022-06-13 ENCOUNTER — Inpatient Hospital Stay: Payer: Medicare HMO | Admitting: Oncology

## 2022-06-13 VITALS — BP 143/87 | HR 106 | Temp 98.1°F | Resp 16 | Ht 70.0 in | Wt 250.0 lb

## 2022-06-13 DIAGNOSIS — I10 Essential (primary) hypertension: Secondary | ICD-10-CM | POA: Diagnosis not present

## 2022-06-13 DIAGNOSIS — Z79899 Other long term (current) drug therapy: Secondary | ICD-10-CM | POA: Diagnosis not present

## 2022-06-13 DIAGNOSIS — D509 Iron deficiency anemia, unspecified: Secondary | ICD-10-CM

## 2022-06-13 NOTE — Addendum Note (Signed)
Addended by: Delice Bison E on: 06/13/2022 04:02 PM   Modules accepted: Orders
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
## 2022-07-23 DIAGNOSIS — E1165 Type 2 diabetes mellitus with hyperglycemia: Secondary | ICD-10-CM | POA: Diagnosis not present

## 2022-07-23 DIAGNOSIS — I10 Essential (primary) hypertension: Secondary | ICD-10-CM | POA: Diagnosis not present

## 2022-07-23 DIAGNOSIS — E782 Mixed hyperlipidemia: Secondary | ICD-10-CM | POA: Diagnosis not present

## 2022-07-25 DIAGNOSIS — I10 Essential (primary) hypertension: Secondary | ICD-10-CM | POA: Diagnosis not present

## 2022-07-25 DIAGNOSIS — E785 Hyperlipidemia, unspecified: Secondary | ICD-10-CM | POA: Diagnosis not present

## 2022-07-25 DIAGNOSIS — G603 Idiopathic progressive neuropathy: Secondary | ICD-10-CM | POA: Diagnosis not present

## 2022-07-25 DIAGNOSIS — R7303 Prediabetes: Secondary | ICD-10-CM | POA: Diagnosis not present

## 2022-08-13 DIAGNOSIS — R202 Paresthesia of skin: Secondary | ICD-10-CM | POA: Diagnosis not present

## 2022-08-13 DIAGNOSIS — M542 Cervicalgia: Secondary | ICD-10-CM | POA: Diagnosis not present

## 2022-08-13 DIAGNOSIS — M79601 Pain in right arm: Secondary | ICD-10-CM | POA: Diagnosis not present

## 2022-08-23 DIAGNOSIS — R202 Paresthesia of skin: Secondary | ICD-10-CM | POA: Diagnosis not present

## 2022-08-23 DIAGNOSIS — M542 Cervicalgia: Secondary | ICD-10-CM | POA: Diagnosis not present

## 2022-08-23 DIAGNOSIS — M79601 Pain in right arm: Secondary | ICD-10-CM | POA: Diagnosis not present

## 2022-08-27 DIAGNOSIS — R202 Paresthesia of skin: Secondary | ICD-10-CM | POA: Diagnosis not present

## 2022-08-27 DIAGNOSIS — M79601 Pain in right arm: Secondary | ICD-10-CM | POA: Diagnosis not present

## 2022-08-27 DIAGNOSIS — M542 Cervicalgia: Secondary | ICD-10-CM | POA: Diagnosis not present

## 2022-08-29 DIAGNOSIS — M542 Cervicalgia: Secondary | ICD-10-CM | POA: Diagnosis not present

## 2022-08-29 DIAGNOSIS — R202 Paresthesia of skin: Secondary | ICD-10-CM | POA: Diagnosis not present

## 2022-08-29 DIAGNOSIS — M79601 Pain in right arm: Secondary | ICD-10-CM | POA: Diagnosis not present

## 2022-09-04 DIAGNOSIS — M79601 Pain in right arm: Secondary | ICD-10-CM | POA: Diagnosis not present

## 2022-09-04 DIAGNOSIS — R202 Paresthesia of skin: Secondary | ICD-10-CM | POA: Diagnosis not present

## 2022-09-04 DIAGNOSIS — M542 Cervicalgia: Secondary | ICD-10-CM | POA: Diagnosis not present

## 2022-09-10 DIAGNOSIS — M542 Cervicalgia: Secondary | ICD-10-CM | POA: Diagnosis not present

## 2022-09-10 DIAGNOSIS — R202 Paresthesia of skin: Secondary | ICD-10-CM | POA: Diagnosis not present

## 2022-09-10 DIAGNOSIS — M79601 Pain in right arm: Secondary | ICD-10-CM | POA: Diagnosis not present

## 2022-09-12 DIAGNOSIS — M79601 Pain in right arm: Secondary | ICD-10-CM | POA: Diagnosis not present

## 2022-09-12 DIAGNOSIS — M542 Cervicalgia: Secondary | ICD-10-CM | POA: Diagnosis not present

## 2022-09-12 DIAGNOSIS — R202 Paresthesia of skin: Secondary | ICD-10-CM | POA: Diagnosis not present

## 2022-09-18 DIAGNOSIS — M542 Cervicalgia: Secondary | ICD-10-CM | POA: Diagnosis not present

## 2022-09-18 DIAGNOSIS — M79601 Pain in right arm: Secondary | ICD-10-CM | POA: Diagnosis not present

## 2022-09-18 DIAGNOSIS — R202 Paresthesia of skin: Secondary | ICD-10-CM | POA: Diagnosis not present

## 2022-09-24 DIAGNOSIS — R202 Paresthesia of skin: Secondary | ICD-10-CM | POA: Diagnosis not present

## 2022-09-24 DIAGNOSIS — M79601 Pain in right arm: Secondary | ICD-10-CM | POA: Diagnosis not present

## 2022-09-24 DIAGNOSIS — M542 Cervicalgia: Secondary | ICD-10-CM | POA: Diagnosis not present

## 2022-09-26 ENCOUNTER — Other Ambulatory Visit: Payer: Self-pay | Admitting: Nurse Practitioner

## 2022-09-26 DIAGNOSIS — M79601 Pain in right arm: Secondary | ICD-10-CM | POA: Diagnosis not present

## 2022-09-26 DIAGNOSIS — R202 Paresthesia of skin: Secondary | ICD-10-CM | POA: Diagnosis not present

## 2022-09-26 DIAGNOSIS — M542 Cervicalgia: Secondary | ICD-10-CM | POA: Diagnosis not present

## 2022-10-12 ENCOUNTER — Other Ambulatory Visit: Payer: Self-pay | Admitting: Nurse Practitioner

## 2022-11-08 ENCOUNTER — Other Ambulatory Visit: Payer: Medicare PPO

## 2022-11-08 ENCOUNTER — Encounter: Payer: Self-pay | Admitting: Oncology

## 2022-11-08 ENCOUNTER — Other Ambulatory Visit: Payer: Self-pay

## 2022-11-08 DIAGNOSIS — R5383 Other fatigue: Secondary | ICD-10-CM | POA: Diagnosis not present

## 2022-11-08 DIAGNOSIS — E782 Mixed hyperlipidemia: Secondary | ICD-10-CM | POA: Diagnosis not present

## 2022-11-08 DIAGNOSIS — E1165 Type 2 diabetes mellitus with hyperglycemia: Secondary | ICD-10-CM | POA: Diagnosis not present

## 2022-11-09 LAB — LIPID PANEL
Chol/HDL Ratio: 2.1 ratio (ref 0.0–5.0)
Cholesterol, Total: 107 mg/dL (ref 100–199)
HDL: 52 mg/dL (ref >39)
LDL Chol Calc (NIH): 42 mg/dL (ref 0–99)
Triglycerides: 57 mg/dL (ref 0–149)
VLDL Cholesterol Cal: 13 mg/dL (ref 5–40)

## 2022-11-09 LAB — CBC WITH DIFFERENTIAL
Basophils Absolute: 0 10*3/uL (ref 0.0–0.2)
Basos: 0 %
EOS (ABSOLUTE): 0.2 10*3/uL (ref 0.0–0.4)
Eos: 3 %
Hematocrit: 35.3 % — ABNORMAL LOW (ref 37.5–51.0)
Hemoglobin: 12 g/dL — ABNORMAL LOW (ref 13.0–17.7)
Immature Grans (Abs): 0 10*3/uL (ref 0.0–0.1)
Immature Granulocytes: 0 %
Lymphocytes Absolute: 1.8 10*3/uL (ref 0.7–3.1)
Lymphs: 24 %
MCH: 30.7 pg (ref 26.6–33.0)
MCHC: 34 g/dL (ref 31.5–35.7)
MCV: 90 fL (ref 79–97)
Monocytes Absolute: 0.5 10*3/uL (ref 0.1–0.9)
Monocytes: 7 %
Neutrophils Absolute: 4.9 10*3/uL (ref 1.4–7.0)
Neutrophils: 66 %
RBC: 3.91 x10E6/uL — ABNORMAL LOW (ref 4.14–5.80)
RDW: 13 % (ref 11.6–15.4)
WBC: 7.5 10*3/uL (ref 3.4–10.8)

## 2022-11-09 LAB — CMP14+EGFR
ALT: 17 IU/L (ref 0–44)
AST: 18 IU/L (ref 0–40)
Albumin/Globulin Ratio: 1.9 (ref 1.2–2.2)
Albumin: 4 g/dL (ref 3.8–4.8)
Alkaline Phosphatase: 84 IU/L (ref 44–121)
BUN/Creatinine Ratio: 16 (ref 10–24)
BUN: 22 mg/dL (ref 8–27)
Bilirubin Total: 0.7 mg/dL (ref 0.0–1.2)
CO2: 24 mmol/L (ref 20–29)
Calcium: 9.4 mg/dL (ref 8.6–10.2)
Chloride: 98 mmol/L (ref 96–106)
Creatinine, Ser: 1.37 mg/dL — ABNORMAL HIGH (ref 0.76–1.27)
Globulin, Total: 2.1 g/dL (ref 1.5–4.5)
Glucose: 115 mg/dL — ABNORMAL HIGH (ref 70–99)
Potassium: 4.1 mmol/L (ref 3.5–5.2)
Sodium: 137 mmol/L (ref 134–144)
Total Protein: 6.1 g/dL (ref 6.0–8.5)
eGFR: 53 mL/min/{1.73_m2} — ABNORMAL LOW (ref >59)

## 2022-11-09 LAB — TSH: TSH: 1.54 u[IU]/mL (ref 0.450–4.500)

## 2022-11-09 LAB — HEMOGLOBIN A1C
Est. average glucose Bld gHb Est-mCnc: 131 mg/dL
Hgb A1c MFr Bld: 6.2 % — ABNORMAL HIGH (ref 4.8–5.6)

## 2022-11-12 ENCOUNTER — Encounter: Payer: Self-pay | Admitting: Nurse Practitioner

## 2022-11-12 ENCOUNTER — Ambulatory Visit (INDEPENDENT_AMBULATORY_CARE_PROVIDER_SITE_OTHER): Payer: Medicare PPO | Admitting: Nurse Practitioner

## 2022-11-12 VITALS — BP 170/110 | HR 91 | Ht 70.0 in | Wt 263.4 lb

## 2022-11-12 DIAGNOSIS — I10 Essential (primary) hypertension: Secondary | ICD-10-CM

## 2022-11-12 DIAGNOSIS — E782 Mixed hyperlipidemia: Secondary | ICD-10-CM

## 2022-11-12 DIAGNOSIS — R7303 Prediabetes: Secondary | ICD-10-CM | POA: Diagnosis not present

## 2022-11-12 DIAGNOSIS — J301 Allergic rhinitis due to pollen: Secondary | ICD-10-CM | POA: Diagnosis not present

## 2022-11-12 NOTE — Patient Instructions (Signed)
1) EKG today 2) Pt will follow up with hematology 3) Follow up appt in 3 months, 2 weeks, fasting labs prior

## 2022-11-12 NOTE — Progress Notes (Signed)
Established Patient Office Visit  Subjective:  Patient ID: [name], male    DOB: Jan 02, 1946  Age: 77 y.o. MRN: [id number]  Chief Complaint  Patient presents with   Follow-up    3 month lab results    3 month follow up, review of fasting labs.  A1c is at 6.2% and LDL is at goal.  Pt brings in BP and HR log.  Pt's EKG abnormal today.  Will re-schedule patient to see Cards.      No other concerns at this time.   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   LIPOMA EXCISION Left 2008   neck    Social History   Socioeconomic History   Marital status: Married    Spouse name: Not on file   Number of children: Not on file   Years of education: Not on file   Highest education level: Not on file  Occupational History   Occupation: Retired  Tobacco Use   Smoking status: Never   Smokeless tobacco: Never   Tobacco comments:    Tobacco use-no  Vaping Use   Vaping Use: Never used  Substance and Sexual Activity   Alcohol use: Yes    Comment: Occasional   Drug use: Not on file   Sexual activity: Not on file  Other Topics Concern   Not on file  Social History Narrative   Not on file   Social Determinants of Health   Financial Resource Strain: Not on file  Food Insecurity: Not on file  Transportation Needs: Not on file  Physical Activity: Not on file  Stress: Not on file  Social Connections: Not on file  Intimate Partner Violence: Not on file    Family History  Problem Relation Age of Onset   Hypertension Mother    Cancer Father 39   Asthma Sister     Allergies  Allergen Reactions   Norvasc [Amlodipine]     Note: Swelling in feet    Review of Systems  Constitutional: Negative.   HENT: Negative.    Eyes: Negative.   Respiratory: Negative.    Cardiovascular: Negative.   Gastrointestinal:  Negative.   Genitourinary: Negative.   Musculoskeletal: Negative.   Skin: Negative.   Neurological: Negative.   Endo/Heme/Allergies: Negative.   Psychiatric/Behavioral: Negative.         Objective:   BP (!) 170/110   Pulse 91   Ht 5\' 10"  (1.778 m)   Wt 263 lb 6.4 oz (119.5 kg)   SpO2 97%   BMI 37.79 kg/m   Vitals:   11/12/22 1337  BP: (!) 170/110  Pulse: 91  Height: 5\' 10"  (1.778 m)  Weight: 263 lb 6.4 oz (119.5 kg)  SpO2: 97%  BMI (Calculated): 37.79    Physical Exam Vitals reviewed.  Constitutional:      Appearance: Normal appearance.  HENT:     Head: Normocephalic.     Nose: Nose normal.     Mouth/Throat:     Mouth: Mucous membranes are moist.  Eyes:     Pupils: Pupils are equal, round, and reactive to light.  Cardiovascular:     Rate and Rhythm: Normal rate and regular rhythm.  Pulmonary:     Effort: Pulmonary effort is normal.     Breath sounds: Normal breath sounds.  Abdominal:  General: Bowel sounds are normal.     Palpations: Abdomen is soft.  Musculoskeletal:        General: Normal range of motion.     Cervical back: Normal range of motion and neck supple.  Skin:    General: Skin is warm and dry.  Neurological:     Mental Status: He is alert and oriented to person, place, and time.  Psychiatric:        Mood and Affect: Mood normal.        Behavior: Behavior normal.      No results found for any visits on 11/12/22.  Recent Results (from the past 2160 hour(s))  CBC With Differential     Status: Abnormal   Collection Time: 11/08/22  1:03 PM  Result Value Ref Range   WBC 7.5 3.4 - 10.8 x10E3/uL   RBC 3.91 (L) 4.14 - 5.80 x10E6/uL   Hemoglobin 12.0 (L) 13.0 - 17.7 g/dL   Hematocrit 35.3 (L) 37.5 - 51.0 %   MCV 90 79 - 97 fL   MCH 30.7 26.6 - 33.0 pg   MCHC 34.0 31.5 - 35.7 g/dL   RDW 13.0 11.6 - 15.4 %   Neutrophils 66 Not Estab. %   Lymphs 24 Not Estab. %   Monocytes 7 Not Estab. %   Eos 3 Not Estab. %   Basos 0 Not Estab. %    Neutrophils Absolute 4.9 1.4 - 7.0 x10E3/uL   Lymphocytes Absolute 1.8 0.7 - 3.1 x10E3/uL   Monocytes Absolute 0.5 0.1 - 0.9 x10E3/uL   EOS (ABSOLUTE) 0.2 0.0 - 0.4 x10E3/uL   Basophils Absolute 0.0 0.0 - 0.2 x10E3/uL   Immature Granulocytes 0 Not Estab. %   Immature Grans (Abs) 0.0 0.0 - 0.1 x10E3/uL  CMP14+EGFR     Status: Abnormal   Collection Time: 11/08/22  1:03 PM  Result Value Ref Range   Glucose 115 (H) 70 - 99 mg/dL   BUN 22 8 - 27 mg/dL   Creatinine, Ser 1.37 (H) 0.76 - 1.27 mg/dL   eGFR 53 (L) >59 mL/min/1.73   BUN/Creatinine Ratio 16 10 - 24   Sodium 137 134 - 144 mmol/L   Potassium 4.1 3.5 - 5.2 mmol/L   Chloride 98 96 - 106 mmol/L   CO2 24 20 - 29 mmol/L   Calcium 9.4 8.6 - 10.2 mg/dL   Total Protein 6.1 6.0 - 8.5 g/dL   Albumin 4.0 3.8 - 4.8 g/dL   Globulin, Total 2.1 1.5 - 4.5 g/dL   Albumin/Globulin Ratio 1.9 1.2 - 2.2   Bilirubin Total 0.7 0.0 - 1.2 mg/dL   Alkaline Phosphatase 84 44 - 121 IU/L   AST 18 0 - 40 IU/L   ALT 17 0 - 44 IU/L  Lipid panel     Status: None   Collection Time: 11/08/22  1:03 PM  Result Value Ref Range   Cholesterol, Total 107 100 - 199 mg/dL   Triglycerides 57 0 - 149 mg/dL   HDL 52 >39 mg/dL   VLDL Cholesterol Cal 13 5 - 40 mg/dL   LDL Chol Calc (NIH) 42 0 - 99 mg/dL   Chol/HDL Ratio 2.1 0.0 - 5.0 ratio    Comment:                                   T. Chol/HDL Ratio  Men  Women                               1/2 Avg.Risk  3.4    3.3                                   Avg.Risk  5.0    4.4                                2X Avg.Risk  9.6    7.1                                3X Avg.Risk 23.4   11.0   Hemoglobin A1c     Status: Abnormal   Collection Time: 11/08/22  1:03 PM  Result Value Ref Range   Hgb A1c MFr Bld 6.2 (H) 4.8 - 5.6 %    Comment:          Prediabetes: 5.7 - 6.4          Diabetes: >6.4          Glycemic control for adults with diabetes: <7.0    Est. average glucose Bld  gHb Est-mCnc 131 mg/dL  TSH     Status: None   Collection Time: 11/08/22  1:03 PM  Result Value Ref Range   TSH 1.540 0.450 - 4.500 uIU/mL      Assessment & Plan:   Problem List Items Addressed This Visit       Cardiovascular and Mediastinum   HYPERTENSION, BENIGN - Primary     Respiratory   Allergic rhinitis     Other   Hyperlipidemia   Prediabetes    Return in about 3 months (around 02/12/2023).   Total time spent: 35 minutes  Evern Bio, NP  11/12/2022

## 2022-11-13 ENCOUNTER — Encounter: Payer: Self-pay | Admitting: Oncology

## 2022-11-13 ENCOUNTER — Ambulatory Visit (INDEPENDENT_AMBULATORY_CARE_PROVIDER_SITE_OTHER): Payer: Medicare PPO | Admitting: Cardiology

## 2022-11-13 ENCOUNTER — Encounter: Payer: Self-pay | Admitting: Cardiology

## 2022-11-13 VITALS — BP 136/84 | HR 89 | Ht 70.0 in | Wt 261.6 lb

## 2022-11-13 DIAGNOSIS — E782 Mixed hyperlipidemia: Secondary | ICD-10-CM | POA: Diagnosis not present

## 2022-11-13 DIAGNOSIS — I10 Essential (primary) hypertension: Secondary | ICD-10-CM

## 2022-11-13 DIAGNOSIS — R0609 Other forms of dyspnea: Secondary | ICD-10-CM | POA: Diagnosis not present

## 2022-11-13 DIAGNOSIS — I4510 Unspecified right bundle-branch block: Secondary | ICD-10-CM | POA: Diagnosis not present

## 2022-11-13 NOTE — Assessment & Plan Note (Signed)
LDL 11/08/22 42. Continue atorvastatin 10 mg daily.

## 2022-11-13 NOTE — Assessment & Plan Note (Signed)
Improved on recheck. Continue to monitor at home and continue current therapy.

## 2022-11-13 NOTE — Progress Notes (Signed)
Cardiology Office Note   Date:  11/13/2022   ID:  Jay, [name] 05/27/1946, MRN [id number]  PCP:  Evern Bio, NP  Cardiologist:  Elsworth Soho, NP      History of Present Illness: [name] is a 77 y.o. male who presents for  Chief Complaint  Patient presents with   Follow-up    Follow up    Patient in office for routine cardiac exam. Denies chest pain, dizziness, lower extremity edema. Complains of dyspnea on exertion.    Shortness of Breath This is a chronic problem. The current episode started more than 1 month ago. The problem occurs intermittently. The problem has been unchanged. The symptoms are aggravated by exercise. He has tried rest for the symptoms. The treatment provided mild relief.    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       atorvastatin (LIPITOR) 10 MG tablet Take 10 mg by mouth daily.     furosemide (LASIX) 40 MG tablet Take 40 mg by mouth daily.     hydrALAZINE (APRESOLINE) 100 MG tablet Take 100 mg by mouth 3 (three) times daily.     losartan-hydrochlorothiazide (HYZAAR) 100-25 MG tablet TAKE 1 TABLET EVERY DAY 90 tablet 3   metFORMIN (GLUCOPHAGE) 500 MG tablet TAKE 1 TABLET TWICE DAILY 180 tablet 3   metoprolol succinate (TOPROL-XL) 50 MG 24 hr tablet Take 50 mg by mouth daily.     metoprolol tartrate (LOPRESSOR) 100 MG tablet Take 100 mg by mouth daily.     Multiple Vitamins-Minerals (MENS 50+ MULTIVITAMIN PO) Take 1 tablet by mouth 1 day or 1 dose.     potassium chloride (KLOR-CON) 10 MEQ tablet Potassium Chloride Crys ER 10 MEQ Oral Tablet Extended Release QTY: 90 tablet Days: 90 Refills: 1  Written: 04/23/21 Patient  Instructions: TAKE 1 TABLET EVERY MORNING WITH FUROSEMIDE     Vitamin D, Ergocalciferol, (DRISDOL) 1.25 MG (50000 UNIT) CAPS capsule Take 1 capsule by mouth every 7 (seven) days.      No current facility-administered medications for this visit.    Allergies:   Norvasc [amlodipine]    Social History:   reports that he has never smoked. He has never used smokeless tobacco. He reports current alcohol use. No history on file for drug use.   Family History:  family history includes Asthma in his sister; Cancer (age of onset: 79) in his father; Hypertension in his mother.    ROS:     Review of Systems  Constitutional: Negative.   HENT: Negative.    Eyes: Negative.   Respiratory:  Positive for shortness of breath.   Cardiovascular: Negative.   Gastrointestinal: Negative.   Skin: Negative.   Neurological:  Negative for dizziness.  All other systems reviewed and are negative.   All other systems are reviewed and negative.   PHYSICAL EXAM: VS:  BP 136/84 (BP Location: Left Arm, Patient Position: Sitting, Cuff Size: Large)   Pulse 89   Ht 5\' 10"  (1.778 m)   Wt 118.7 kg   SpO2 97%   BMI 37.54 kg/m  , BMI Body mass index is 37.54 kg/m.  Last weight:  Wt Readings from Last 3 Encounters:  11/13/22 118.7 kg  11/12/22 119.5 kg  06/13/22 113.4 kg    Physical Exam Constitutional:      Appearance: Normal appearance. He is normal weight.  HENT:     Head: Normocephalic and atraumatic.  Eyes:     Extraocular Movements: Extraocular movements intact.     Conjunctiva/sclera: Conjunctivae normal.     Pupils: Pupils are equal, round, and reactive to light.  Cardiovascular:     Rate and Rhythm: Normal rate and regular rhythm.  Pulmonary:     Effort: Pulmonary effort is normal.     Breath sounds: Normal breath sounds.  Abdominal:     General: Abdomen is flat. Bowel sounds are normal.     Palpations: Abdomen is soft.  Musculoskeletal:        General: Normal range of motion.      Cervical back: Normal range of motion.  Neurological:     General: No focal deficit present.     Mental Status: He is alert and oriented to person, place, and time. Mental status is at baseline.  Psychiatric:        Mood and Affect: Mood normal.        Behavior: Behavior normal.        Thought Content: Thought content normal.        Judgment: Judgment normal.     EKG: 11/12/22 sinus rhythm with PACs, RBBB, HR 69 bpm  Recent Labs: 06/12/2022: Platelets 203 11/08/2022: ALT 17; BUN 22; Creatinine, Ser 1.37; Hemoglobin 12.0; Potassium 4.1; Sodium 137; TSH 1.540    Lipid Panel    Component Value Date/Time   CHOL 107 11/08/2022 1303   TRIG 57 11/08/2022 1303   HDL 52 11/08/2022 1303   CHOLHDL 2.1 11/08/2022 1303   LDLCALC 42 11/08/2022 1303    Other studies Reviewed: Patient: [name] DOB:  05-Jul-1946  Date:  04/21/2018 09:00 Provider: Neoma Laming MD Encounter: ECHO   Page 2 REASON FOR VISIT  Visit for: Echocardiogram - Heart failure   Sex: Male     wt=  266 lbs.  BP= 160/110  Height= 71 inches.    TESTS  Imaging: Echocardiogram:  An echocardiogram in (2-d) mode was performed and in Doppler mode with color flow velocity mapping was performed. The aortic valve cusps are normal 2.1 cm, flow velocity was normal 1.4 m/s, and normal calculated aortic valve systolic mean flow gradient 5.6 mmHg. Mitral valve diastolic peak flow velocity E .84 m/s and E/A ratio .98. Aortic root diameter 3.9 cm. The LVOT internal diameter was normal 2.0 cm and flow velocity was normal 1.2 m/s. LV systolic dimension 2.7 cm, diastolic 4.8 cm, posterior wall thickness 1.2 cm, fractional shortening 41 %, and EF 72 %. IVS thickness 1.7 cm. LA dimension 6.6 cm  RIGHT atrium= 30.1 cm2. Mitral Valve =  Ea= 9.6  DT= .180  A- wave duration = .090 m.sec. Tricuspid Valve =  TR jet V= 2.5     RAP= 5  RVSP= 31.0 mmHg. Tricuspid Valve has Mild to Moderate Regurgitation. Pulmonic  Valve= PIEDV= 1.5 m/s. Mitral Valve has Mild to Moderate Regurgitation. Aortic Valve has Mild Regurgitation. Pulmonic Valve has Mild Regurgitation.   ASSESSMENT  Technically adequate study.  Severe biatrial enlargement, mildly dilated aortic root, and ascending aorta with ventricles appearing normal in size.  Normal LV systolic function.  Normal wall motion.  Mild left ventricular hypertrophy with GRADE 1 (relaxation  abnormality) diastolic dysfunction.  Mild pulmonary regurgitation.  Mild to moderate tricuspid regurgitation.   Normal pulmonary artery pressure.   Mild to moderate mitral regurgitation.  Mild aortic regurgitation.   No pericardial effusion.  Anterior pericardial fat present.  Incidental finding on left lobe of liver; round and echogenic measuring 4.2 x 1.2 cm.   THERAPY   Referring physician: Dionisio David  Sonographer: Iverson Alamin, RCS. Neoma Laming MD  Electronically signed by: Neoma Laming     Date: 04/22/2018 12:04  Patient: [place]. Adarryl Trottier DOB:  December 05, 1945  Date:  04/06/2018 07:30 Provider: Neoma Laming MD Encounter: [facility] TEST   Page 2 TESTS   [facility] [address] [place], [place] [address] [phone] STUDY:  Rest / Gated Stress Myocardial Perfusion With Wall Motion, Left Ventricular Ejection Fraction.Treadmill Stress Test. SEX:       Male                                                                                                                                                                                                                  REFERRING PHYSICIAN:  Dr.Shaukat Humphrey Rolls                                                                                                                                                                                                                      INDICATION FOR STUDY:  Abnormal EKG.  TECHNIQUE:  Approximately 20 minutes following the intravenous administration of 12.2 mCi of Tc-36m Sestamibi after stress testing in a reclined supine position with arms above their head if able to do so,gated SPECT imaging of the heart was performed. After about a 2hr break, the patient was injected intravenously with 33.0 mCi of Tc-75m Sestamibi.  Approximately 45 minutes later in the same position as stress imaging  SPECT rest imaging of the heart was performed.  STRESS BY:  Neoma Laming, MD PROTOCOL:   Darnell Level                                                                                       MAX PRED HR: 148                     85%: 126               75%: 111                                                                                                                   RESTING BP: 190/120     RESTING HR: 77    PEAK BP: 237/125      PEAK HR: 139                                                                    EXERCISE DURATION: 4:01                                             METS:  5.5    REASON FOR TEST TERMINATION:  Target reached/1 min post injection  SYMPTOMS: None  [facility] TREADMILL SCORE: 4                                       RISK: Moderate                                                                                                                                                                                                            EKG RESULTS:  NSR. 77/min. No significant ST changes at peak exercise.  PERFUSION/WALL MOTION FINDINGS:  EF = 49%. No perfusion defects, mild diffuse hypokinesis.                                                                         IMPRESSION: No perfusion defects however has mild LV dysfunction.                                                                                                                                                                                                                                                                                        Neoma Laming, MD Stress Interpreting Physician / Nuclear Interpreting Physician  Neoma Laming MD  Electronically signed by: Neoma Laming     Date: 04/10/2018 10:13   ASSESSMENT AND PLAN:    ICD-10-CM   1. Dyspnea on exertion  R06.09 MYOCARDIAL PERFUSION IMAGING    2. Essential hypertension, benign  I10 PCV ECHOCARDIOGRAM COMPLETE    MYOCARDIAL PERFUSION IMAGING    3. Mixed hyperlipidemia  E78.2 MYOCARDIAL PERFUSION IMAGING    4. RBBB  I45.10        Problem List Items Addressed This Visit       Cardiovascular and Mediastinum   HYPERTENSION, BENIGN    Improved on recheck. Continue to monitor at home and continue current therapy.      Relevant Orders   PCV ECHOCARDIOGRAM COMPLETE   MYOCARDIAL PERFUSION IMAGING   RBBB     Other   Dyspnea on exertion - Primary    Patient denies chest pain. Will order stress test and echo to check heart function and for blockages.       Relevant Orders   MYOCARDIAL PERFUSION IMAGING   Hyperlipidemia    LDL 11/08/22 42. Continue atorvastatin 10 mg daily.      Relevant Orders   MYOCARDIAL PERFUSION IMAGING       Disposition:   Return in about  4 weeks (around 12/11/2022) for after stress test, echo.    Total time spent: 30 minutes  Signed,  Elsworth Soho, NP  11/13/2022 11:47 AM     [place]

## 2022-11-13 NOTE — Assessment & Plan Note (Signed)
Patient denies chest pain. Will order stress test and echo to check heart function and for blockages.

## 2022-11-27 ENCOUNTER — Other Ambulatory Visit: Payer: Self-pay | Admitting: Nurse Practitioner

## 2022-12-04 ENCOUNTER — Encounter: Payer: Self-pay | Admitting: Oncology

## 2022-12-08 ENCOUNTER — Other Ambulatory Visit: Payer: Self-pay | Admitting: Nurse Practitioner

## 2022-12-09 ENCOUNTER — Ambulatory Visit (INDEPENDENT_AMBULATORY_CARE_PROVIDER_SITE_OTHER): Payer: Self-pay

## 2022-12-09 DIAGNOSIS — I340 Nonrheumatic mitral (valve) insufficiency: Secondary | ICD-10-CM | POA: Diagnosis not present

## 2022-12-09 DIAGNOSIS — I10 Essential (primary) hypertension: Secondary | ICD-10-CM

## 2022-12-09 DIAGNOSIS — I371 Nonrheumatic pulmonary valve insufficiency: Secondary | ICD-10-CM

## 2022-12-09 DIAGNOSIS — I361 Nonrheumatic tricuspid (valve) insufficiency: Secondary | ICD-10-CM | POA: Diagnosis not present

## 2022-12-10 ENCOUNTER — Ambulatory Visit (INDEPENDENT_AMBULATORY_CARE_PROVIDER_SITE_OTHER): Payer: Medicare PPO

## 2022-12-10 DIAGNOSIS — R0609 Other forms of dyspnea: Secondary | ICD-10-CM

## 2022-12-10 DIAGNOSIS — I10 Essential (primary) hypertension: Secondary | ICD-10-CM

## 2022-12-10 DIAGNOSIS — E782 Mixed hyperlipidemia: Secondary | ICD-10-CM

## 2022-12-10 MED ORDER — TECHNETIUM TC 99M SESTAMIBI GENERIC - CARDIOLITE
33.0000 | Freq: Once | INTRAVENOUS | Status: AC | PRN
Start: 2022-12-10 — End: 2022-12-10
  Administered 2022-12-10: 33 via INTRAVENOUS

## 2022-12-10 MED ORDER — TECHNETIUM TC 99M SESTAMIBI GENERIC - CARDIOLITE
11.0000 | Freq: Once | INTRAVENOUS | Status: AC | PRN
Start: 2022-12-10 — End: 2022-12-10
  Administered 2022-12-10: 11 via INTRAVENOUS

## 2022-12-11 ENCOUNTER — Encounter: Payer: Self-pay | Admitting: Oncology

## 2022-12-11 ENCOUNTER — Inpatient Hospital Stay: Payer: Medicare PPO | Attending: Oncology

## 2022-12-11 DIAGNOSIS — D509 Iron deficiency anemia, unspecified: Secondary | ICD-10-CM | POA: Insufficient documentation

## 2022-12-11 LAB — CBC WITH DIFFERENTIAL/PLATELET
Abs Immature Granulocytes: 0.02 10*3/uL (ref 0.00–0.07)
Basophils Absolute: 0 10*3/uL (ref 0.0–0.1)
Basophils Relative: 0 %
Eosinophils Absolute: 0.2 10*3/uL (ref 0.0–0.5)
Eosinophils Relative: 2 %
HCT: 37.5 % — ABNORMAL LOW (ref 39.0–52.0)
Hemoglobin: 12.7 g/dL — ABNORMAL LOW (ref 13.0–17.0)
Immature Granulocytes: 0 %
Lymphocytes Relative: 26 %
Lymphs Abs: 1.8 10*3/uL (ref 0.7–4.0)
MCH: 31 pg (ref 26.0–34.0)
MCHC: 33.9 g/dL (ref 30.0–36.0)
MCV: 91.5 fL (ref 80.0–100.0)
Monocytes Absolute: 0.5 10*3/uL (ref 0.1–1.0)
Monocytes Relative: 8 %
Neutro Abs: 4.3 10*3/uL (ref 1.7–7.7)
Neutrophils Relative %: 64 %
Platelets: 189 10*3/uL (ref 150–400)
RBC: 4.1 MIL/uL — ABNORMAL LOW (ref 4.22–5.81)
RDW: 13.6 % (ref 11.5–15.5)
WBC: 6.9 10*3/uL (ref 4.0–10.5)
nRBC: 0 % (ref 0.0–0.2)

## 2022-12-11 LAB — IRON AND TIBC
Iron: 110 ug/dL (ref 45–182)
Saturation Ratios: 38 % (ref 17.9–39.5)
TIBC: 290 ug/dL (ref 250–450)
UIBC: 180 ug/dL

## 2022-12-11 LAB — FERRITIN: Ferritin: 379 ng/mL — ABNORMAL HIGH (ref 24–336)

## 2022-12-11 MED FILL — Iron Sucrose Inj 20 MG/ML (Fe Equiv): INTRAVENOUS | Qty: 10 | Status: AC

## 2022-12-12 ENCOUNTER — Inpatient Hospital Stay (HOSPITAL_BASED_OUTPATIENT_CLINIC_OR_DEPARTMENT_OTHER): Payer: Medicare PPO | Admitting: Oncology

## 2022-12-12 ENCOUNTER — Inpatient Hospital Stay: Payer: Medicare PPO

## 2022-12-12 ENCOUNTER — Encounter: Payer: Self-pay | Admitting: Oncology

## 2022-12-12 VITALS — BP 163/79 | HR 75 | Temp 97.9°F | Resp 16 | Ht 70.0 in | Wt 255.0 lb

## 2022-12-12 DIAGNOSIS — D509 Iron deficiency anemia, unspecified: Secondary | ICD-10-CM | POA: Diagnosis not present

## 2022-12-12 NOTE — Progress Notes (Signed)
[facility]  [phone] [phone]  ID: [name] OB: 01-01-1946  MR#: [id number]  UXL#:[id number]  Patient Care Team: Orson Eva, NP as PCP - General (Nurse Practitioner) Jeralyn Ruths, MD as Consulting Physician (Oncology)   CHIEF COMPLAINT: Mild iron deficiency anemia.  INTERVAL HISTORY: Patient returns to clinic today for repeat laboratory work, further evaluation, and consideration of additional IV Venofer.  He currently feels well and is asymptomatic.  He does not complain of any weakness or fatigue.  He has no neurologic complaints.  He denies any recent fevers or illnesses.  He has no chest pain, shortness of breath, cough, or hemoptysis.  He denies any nausea, vomiting, constipation, or diarrhea.  He has no melena or hematochezia.  He has no urinary complaints.  Patient offers no specific complaints today.  REVIEW OF SYSTEMS:   Review of Systems  Constitutional: Negative.  Negative for fever, malaise/fatigue and weight loss.  Respiratory: Negative.  Negative for cough, hemoptysis and shortness of breath.   Cardiovascular: Negative.  Negative for chest pain and leg swelling.  Gastrointestinal: Negative.  Negative for abdominal pain, blood in stool and melena.  Genitourinary: Negative.  Negative for hematuria.  Musculoskeletal: Negative.  Negative for back pain.  Skin: Negative.  Negative for rash.  Neurological: Negative.  Negative for dizziness, focal weakness, weakness and headaches.  Psychiatric/Behavioral: Negative.  The patient is not nervous/anxious.     As per HPI. Otherwise, a complete review of systems is negative.  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   LIPOMA EXCISION Left 2008   neck    FAMILY  HISTORY: Family History  Problem Relation Age of Onset   Hypertension Mother    Cancer Father 3   Asthma Sister     ADVANCED DIRECTIVES (Y/N):  N  HEALTH MAINTENANCE: Social History   Tobacco Use   Smoking status: Never   Smokeless tobacco: Never   Tobacco comments:    Tobacco use-no  Vaping Use   Vaping Use: Never used  Substance Use Topics   Alcohol use: Yes    Comment: Occasional     Colonoscopy:  PAP:  Bone density:  Lipid panel:  Allergies  Allergen Reactions   Norvasc [Amlodipine]     Note: Swelling in feet    Current Outpatient Medications  Medication Sig Dispense Refill   aspirin 81 MG EC tablet Take 81 mg by mouth daily.       atorvastatin (LIPITOR) 10 MG tablet Take 10 mg by mouth daily.     furosemide (LASIX) 40 MG tablet TAKE 1 TABLET EVERY MORNING FOR FLUID 90 tablet 3   hydrALAZINE (APRESOLINE) 100 MG tablet TAKE 1 TABLET THREE TIMES DAILY 270 tablet 3   losartan-hydrochlorothiazide (HYZAAR) 100-25 MG tablet TAKE 1 TABLET EVERY DAY 90 tablet 3   metFORMIN (GLUCOPHAGE) 500 MG tablet TAKE 1 TABLET TWICE DAILY 180 tablet 3   metoprolol succinate (TOPROL-XL) 50 MG 24 hr tablet Take 50 mg by mouth daily.     metoprolol tartrate (LOPRESSOR) 100 MG tablet Take 100 mg by mouth daily.     Multiple Vitamins-Minerals (MENS 50+ MULTIVITAMIN PO) Take 1 tablet by mouth 1 day or 1 dose.     potassium chloride (KLOR-CON M) 10 MEQ tablet TAKE 1 TABLET EVERY  MORNING WITH FUROSEMIDE 90 tablet 3   potassium chloride (KLOR-CON) 10 MEQ tablet Potassium Chloride Crys ER 10 MEQ Oral Tablet Extended Release QTY: 90 tablet Days: 90 Refills: 1  Written: 04/23/21 Patient Instructions: TAKE 1 TABLET EVERY MORNING WITH FUROSEMIDE     Vitamin D, Ergocalciferol, (DRISDOL) 1.25 MG (50000 UNIT) CAPS capsule Take 1 capsule by mouth every 7 (seven) days.      No current facility-administered medications for this visit.    OBJECTIVE: Vitals:   12/12/22 1408  BP: (!) 163/79  Pulse:  75  Resp: 16  Temp: 97.9 F (36.6 C)  SpO2: 99%     Body mass index is 36.59 kg/m.    ECOG FS:0 - Asymptomatic  General: Well-developed, well-nourished, no acute distress. Eyes: Pink conjunctiva, anicteric sclera. HEENT: Normocephalic, moist mucous membranes. Lungs: No audible wheezing or coughing. Heart: Regular rate and rhythm. Abdomen: Soft, nontender, no obvious distention. Musculoskeletal: No edema, cyanosis, or clubbing. Neuro: Alert, answering all questions appropriately. Cranial nerves grossly intact. Skin: No rashes or petechiae noted. Psych: Normal affect.  LAB RESULTS:  Lab Results  Component Value Date   NA 137 11/08/2022   K 4.1 11/08/2022   CL 98 11/08/2022   CO2 24 11/08/2022   GLUCOSE 115 (H) 11/08/2022   BUN 22 11/08/2022   CREATININE 1.37 (H) 11/08/2022   CALCIUM 9.4 11/08/2022   PROT 6.1 11/08/2022   ALBUMIN 4.0 11/08/2022   AST 18 11/08/2022   ALT 17 11/08/2022   ALKPHOS 84 11/08/2022   BILITOT 0.7 11/08/2022    Lab Results  Component Value Date   WBC 6.9 12/11/2022   NEUTROABS 4.3 12/11/2022   HGB 12.7 (L) 12/11/2022   HCT 37.5 (L) 12/11/2022   MCV 91.5 12/11/2022   PLT 189 12/11/2022   Lab Results  Component Value Date   IRON 110 12/11/2022   TIBC 290 12/11/2022   IRONPCTSAT 38 12/11/2022   Lab Results  Component Value Date   FERRITIN 379 (H) 12/11/2022      STUDIES: No results found.  ASSESSMENT: Mild iron deficiency anemia.    PLAN:    1. Mild iron deficiency anemia: Resolved.  Patient's hemoglobin remained stable at 12.7 and his iron stores continue to be within normal limits.  Previously, all of his other laboratory work including is either negative or within normal limits.  Patient had luminal evaluation in August 2020 that did not reveal any significant pathology.  Patient last received IV iron on November 20, 2021.  He does not require additional treatment today.  After discussion with the patient, it was agreed upon that no  further follow-up is necessary.  Please refer patient back if there are any questions or concerns.   2.  Hypertension: Chronic and unchanged.  Continue monitoring and treatment per primary care.  I spent a total of 20 minutes reviewing chart data, face-to-face evaluation with the patient, counseling and coordination of care as detailed above.    Patient expressed understanding and was in agreement with this plan. He also understands that He can call clinic at any time with any questions, concerns, or complaints.    Jeralyn Ruths, MD   12/12/2022 2:30 PM

## 2022-12-16 ENCOUNTER — Ambulatory Visit: Payer: Medicare PPO | Admitting: Cardiovascular Disease

## 2022-12-16 ENCOUNTER — Encounter: Payer: Self-pay | Admitting: Cardiovascular Disease

## 2022-12-16 VITALS — BP 165/120 | HR 132 | Ht 70.0 in | Wt 253.4 lb

## 2022-12-16 DIAGNOSIS — R0609 Other forms of dyspnea: Secondary | ICD-10-CM | POA: Diagnosis not present

## 2022-12-16 DIAGNOSIS — E782 Mixed hyperlipidemia: Secondary | ICD-10-CM

## 2022-12-16 DIAGNOSIS — I10 Essential (primary) hypertension: Secondary | ICD-10-CM

## 2022-12-16 DIAGNOSIS — I340 Nonrheumatic mitral (valve) insufficiency: Secondary | ICD-10-CM | POA: Diagnosis not present

## 2022-12-16 DIAGNOSIS — Z8679 Personal history of other diseases of the circulatory system: Secondary | ICD-10-CM | POA: Diagnosis not present

## 2022-12-16 MED ORDER — APIXABAN 5 MG PO TABS
5.0000 mg | ORAL_TABLET | Freq: Two times a day (BID) | ORAL | 4 refills | Status: DC
Start: 2022-12-16 — End: 2023-01-21

## 2022-12-16 MED ORDER — CLONIDINE HCL 0.2 MG PO TABS
0.2000 mg | ORAL_TABLET | Freq: Two times a day (BID) | ORAL | 11 refills | Status: DC
Start: 2022-12-16 — End: 2023-01-26

## 2022-12-16 MED ORDER — AMIODARONE HCL 400 MG PO TABS
400.0000 mg | ORAL_TABLET | Freq: Two times a day (BID) | ORAL | 4 refills | Status: DC
Start: 2022-12-16 — End: 2023-01-06

## 2022-12-16 NOTE — Progress Notes (Signed)
Cardiology Office Note   Date:  12/16/2022   ID:  [name] 1946-05-29, MRN [id number]  PCP:  Orson Eva, NP  Cardiologist:  Adrian Blackwater, MD      History of Present Illness: [name] is a 77 y.o. male who presents for  Chief Complaint  Patient presents with   Follow-up    ECHO/NST results    Palpitations  This is a new problem. The current episode started in the past 7 days. Associated symptoms include shortness of breath.  Shortness of Breath This is a recurrent problem. The problem has been unchanged.      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 60 tablet 4   apixaban (ELIQUIS) 5 MG TABS tablet Take 1 tablet (5 mg total) by mouth 2 (two) times daily. 60 tablet 4   aspirin 81 MG EC tablet Take 81 mg by mouth daily.       atorvastatin (LIPITOR) 10 MG tablet Take 10 mg by mouth daily.     cloNIDine (CATAPRES) 0.2 MG tablet Take 1 tablet (0.2 mg total) by mouth 2 (two) times daily. 60 tablet 11   furosemide (LASIX) 40 MG tablet TAKE 1 TABLET EVERY MORNING FOR FLUID 90 tablet 3   hydrALAZINE (APRESOLINE) 100 MG tablet TAKE 1 TABLET THREE TIMES DAILY 270 tablet 3   losartan-hydrochlorothiazide (HYZAAR) 100-25 MG tablet TAKE 1 TABLET EVERY DAY 90 tablet 3   metFORMIN (GLUCOPHAGE) 500 MG tablet TAKE 1 TABLET TWICE DAILY 180 tablet 3   metoprolol succinate (TOPROL-XL) 50 MG 24 hr tablet Take 50 mg by mouth daily.     Multiple Vitamins-Minerals (MENS 50+ MULTIVITAMIN PO) Take 1 tablet by mouth 1 day or 1 dose.     potassium chloride (KLOR-CON M) 10 MEQ tablet TAKE 1 TABLET EVERY MORNING WITH  FUROSEMIDE 90 tablet 3   Vitamin D, Ergocalciferol, (DRISDOL) 1.25 MG (50000 UNIT) CAPS capsule Take 1 capsule by mouth every 7 (seven) days.      No current facility-administered medications for this visit.    Allergies:   Norvasc [amlodipine]    Social History:   reports that he has never smoked. He has never used smokeless tobacco. He reports current alcohol use. No history on file for drug use.   Family History:  family history includes Asthma in his sister; Cancer (age of onset: 45) in his father; Hypertension in his mother.    ROS:     Review of Systems  Constitutional: Negative.   HENT: Negative.    Eyes: Negative.   Respiratory:  Positive for shortness of breath.   Cardiovascular:  Positive for palpitations.  Gastrointestinal: Negative.   Genitourinary: Negative.   Musculoskeletal: Negative.   Skin: Negative.   Neurological: Negative.   Endo/Heme/Allergies: Negative.   Psychiatric/Behavioral: Negative.    All other systems reviewed and are negative.     All other systems are reviewed and negative.    PHYSICAL EXAM: VS:  BP (!) 165/120   Pulse (!) 132   Ht 5\' 10"  (1.778 m)   Wt 253 lb  6.4 oz (114.9 kg)   SpO2 97%   BMI 36.36 kg/m  , BMI Body mass index is 36.36 kg/m. Last weight:  Wt Readings from Last 3 Encounters:  12/16/22 253 lb 6.4 oz (114.9 kg)  12/12/22 255 lb (115.7 kg)  11/13/22 261 lb 9.6 oz (118.7 kg)     Physical Exam Vitals reviewed.  Constitutional:      Appearance: Normal appearance. He is normal weight.  HENT:     Head: Normocephalic.     Nose: Nose normal.     Mouth/Throat:     Mouth: Mucous membranes are moist.  Eyes:     Pupils: Pupils are equal, round, and reactive to light.  Cardiovascular:     Rate and Rhythm: Tachycardia present. Rhythm irregular.     Pulses: Normal pulses.     Heart sounds: Murmur heard.  Pulmonary:     Effort: Pulmonary effort is normal.  Abdominal:     General: Abdomen is flat. Bowel sounds are  normal.  Musculoskeletal:        General: Normal range of motion.     Cervical back: Normal range of motion.  Skin:    General: Skin is warm.  Neurological:     General: No focal deficit present.     Mental Status: He is alert.  Psychiatric:        Mood and Affect: Mood normal.       EKG: atrial fibrillation, HR 94 bpm, non-specific ST changes  Recent Labs: 11/08/2022: ALT 17; BUN 22; Creatinine, Ser 1.37; Potassium 4.1; Sodium 137; TSH 1.540 12/11/2022: Hemoglobin 12.7; Platelets 189    Lipid Panel    Component Value Date/Time   CHOL 107 11/08/2022 1303   TRIG 57 11/08/2022 1303   HDL 52 11/08/2022 1303   CHOLHDL 2.1 11/08/2022 1303   LDLCALC 42 11/08/2022 1303     Other studies Reviewed: echo, stress test   ASSESSMENT AND PLAN:    ICD-10-CM   1. HYPERTENSION, BENIGN  I10 cloNIDine (CATAPRES) 0.2 MG tablet    2. Dyspnea on exertion  R06.09    stress test showed no ischaemia, ECHO had severe LA, normal EF, grade 3 diastolic dysfunction, mild MR    3. Mixed hyperlipidemia  E78.2     4. Nonrheumatic mitral valve regurgitation  I34.0     5. Atrial fibrillation, currently in sinus rhythm  Z86.79 apixaban (ELIQUIS) 5 MG TABS tablet    amiodarone (PACERONE) 400 MG tablet       Problem List Items Addressed This Visit       Cardiovascular and Mediastinum   HYPERTENSION, BENIGN - Primary    BP very high, 170/90, not compliant as not taking clonidie, will place patient back on clonidine      Relevant Medications   apixaban (ELIQUIS) 5 MG TABS tablet   amiodarone (PACERONE) 400 MG tablet   cloNIDine (CATAPRES) 0.2 MG tablet   Nonrheumatic mitral valve regurgitation   Relevant Medications   apixaban (ELIQUIS) 5 MG TABS tablet   amiodarone (PACERONE) 400 MG tablet   cloNIDine (CATAPRES) 0.2 MG tablet     Other   Dyspnea on exertion   Hyperlipidemia   Relevant Medications   apixaban (ELIQUIS) 5 MG TABS tablet   amiodarone (PACERONE) 400 MG tablet    cloNIDine (CATAPRES) 0.2 MG tablet   Atrial fibrillation, currently in sinus rhythm    Patient in atrial fibrillation. Will add amiodarone, Eliquis.       Relevant Medications  apixaban (ELIQUIS) 5 MG TABS tablet   amiodarone (PACERONE) 400 MG tablet       Disposition:   Return in about 4 weeks (around 01/13/2023).    Total time spent: 40 minutes  Signed,  Adrian Blackwater, MD  12/16/2022 2:30 PM    [facility]

## 2022-12-16 NOTE — Assessment & Plan Note (Signed)
Patient in atrial fibrillation. Will add amiodarone, Eliquis.

## 2022-12-16 NOTE — Assessment & Plan Note (Signed)
BP very high, 170/90, not compliant as not taking clonidie, will place patient back on clonidine

## 2022-12-18 ENCOUNTER — Encounter: Payer: Self-pay | Admitting: Cardiovascular Disease

## 2023-01-03 ENCOUNTER — Telehealth: Payer: Self-pay

## 2023-01-03 NOTE — Telephone Encounter (Signed)
Patient came by the office today stating that since he's started taking the amiodarone, eliquis and clonidine  he's been experiencing really bad headaches, balance off and dizziness. He states it almost feels like he's drunk the way it makes him feel. Please advise on what the patient should do

## 2023-01-06 ENCOUNTER — Encounter: Payer: Self-pay | Admitting: Cardiovascular Disease

## 2023-01-06 ENCOUNTER — Ambulatory Visit (INDEPENDENT_AMBULATORY_CARE_PROVIDER_SITE_OTHER): Payer: Medicare PPO | Admitting: Cardiovascular Disease

## 2023-01-06 VITALS — BP 130/80 | HR 54 | Ht 70.0 in | Wt 253.6 lb

## 2023-01-06 DIAGNOSIS — I10 Essential (primary) hypertension: Secondary | ICD-10-CM

## 2023-01-06 DIAGNOSIS — Z8679 Personal history of other diseases of the circulatory system: Secondary | ICD-10-CM

## 2023-01-06 DIAGNOSIS — E782 Mixed hyperlipidemia: Secondary | ICD-10-CM

## 2023-01-06 DIAGNOSIS — I480 Paroxysmal atrial fibrillation: Secondary | ICD-10-CM

## 2023-01-06 MED ORDER — AMIODARONE HCL 200 MG PO TABS
200.0000 mg | ORAL_TABLET | Freq: Every day | ORAL | 2 refills | Status: DC
Start: 2023-01-06 — End: 2023-01-15

## 2023-01-06 NOTE — Assessment & Plan Note (Signed)
Better controlled since re starting clonidine.

## 2023-01-06 NOTE — Progress Notes (Signed)
Cardiology Office Note   Date:  01/06/2023   ID:  [name], [name] 1946/08/17, MRN [id number]  PCP:  Orson Eva, NP  Cardiologist:  Adrian Blackwater, MD      History of Present Illness: [name] is a 77 y.o. male who presents for  Chief Complaint  Patient presents with   Follow-up    1 month follow up, discuss medications.    Patient in office complaining of dizziness and balance issues, headache since starting amiodarone.      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 30 tablet 2   apixaban (ELIQUIS) 5 MG TABS tablet Take 1 tablet (5 mg total) by mouth 2 (two) times daily. 60 tablet 4   aspirin 81 MG EC tablet Take 81 mg by mouth daily.       atorvastatin (LIPITOR) 10 MG tablet Take 10 mg by mouth daily.     cloNIDine (CATAPRES) 0.2 MG tablet Take 1 tablet (0.2 mg total) by mouth 2 (two) times daily. 60 tablet 11   furosemide (LASIX) 40 MG tablet TAKE 1 TABLET EVERY MORNING FOR FLUID 90 tablet 3   hydrALAZINE (APRESOLINE) 100 MG tablet TAKE 1 TABLET THREE TIMES DAILY 270 tablet 3   losartan-hydrochlorothiazide (HYZAAR) 100-25 MG tablet TAKE 1 TABLET EVERY DAY 90 tablet 3   metFORMIN (GLUCOPHAGE) 500 MG tablet TAKE 1 TABLET TWICE DAILY 180 tablet 3   metoprolol succinate (TOPROL-XL) 50 MG 24 hr tablet Take 50 mg by mouth daily.     Multiple Vitamins-Minerals (MENS 50+ MULTIVITAMIN PO) Take 1 tablet by mouth 1 day or 1 dose.     potassium chloride (KLOR-CON M) 10 MEQ tablet TAKE 1 TABLET EVERY MORNING WITH FUROSEMIDE 90 tablet 3   Vitamin D, Ergocalciferol, (DRISDOL) 1.25 MG (50000 UNIT) CAPS capsule Take 1 capsule by  mouth every 7 (seven) days.      No current facility-administered medications for this visit.    Allergies:   Norvasc [amlodipine]    Social History:   reports that he has never smoked. He has never used smokeless tobacco. He reports current alcohol use. No history on file for drug use.   Family History:  family history includes Asthma in his sister; Cancer (age of onset: 11) in his father; Hypertension in his mother.    ROS:     Review of Systems  Constitutional:  Positive for malaise/fatigue.  HENT: Negative.    Eyes: Negative.   Respiratory: Negative.    Cardiovascular: Negative.  Negative for chest pain.  Gastrointestinal: Negative.   Genitourinary: Negative.   Musculoskeletal: Negative.   Skin: Negative.   Neurological:  Positive for dizziness and headaches.  Endo/Heme/Allergies: Negative.   Psychiatric/Behavioral: Negative.    All other systems reviewed and are negative.   All other systems are reviewed and negative.   PHYSICAL EXAM: VS:  BP 130/80 (BP Location: Left Arm, Patient Position: Sitting, Cuff Size: Large)   Pulse (!) 54   Ht 5\' 10"  (1.778 m)   Wt 253 lb 9.6 oz (115 kg)   SpO2 96%   BMI 36.39  kg/m  , BMI Body mass index is 36.39 kg/m. Last weight:  Wt Readings from Last 3 Encounters:  01/06/23 253 lb 9.6 oz (115 kg)  12/16/22 253 lb 6.4 oz (114.9 kg)  12/12/22 255 lb (115.7 kg)   Physical Exam Vitals reviewed.  Constitutional:      Appearance: Normal appearance. He is normal weight.  HENT:     Head: Normocephalic.     Nose: Nose normal.     Mouth/Throat:     Mouth: Mucous membranes are moist.  Eyes:     Pupils: Pupils are equal, round, and reactive to light.  Cardiovascular:     Rate and Rhythm: Normal rate and regular rhythm.     Pulses: Normal pulses.     Heart sounds: Normal heart sounds.  Pulmonary:     Effort: Pulmonary effort is normal.  Abdominal:     General: Abdomen is flat. Bowel sounds are normal.  Musculoskeletal:         General: Normal range of motion.     Cervical back: Normal range of motion.  Skin:    General: Skin is warm.  Neurological:     General: No focal deficit present.     Mental Status: He is alert.  Psychiatric:        Mood and Affect: Mood normal.     EKG: sinus bradycardia, HR 52 bpm, RBBB  Recent Labs: 11/08/2022: ALT 17; BUN 22; Creatinine, Ser 1.37; Potassium 4.1; Sodium 137; TSH 1.540 12/11/2022: Hemoglobin 12.7; Platelets 189    Lipid Panel    Component Value Date/Time   CHOL 107 11/08/2022 1303   TRIG 57 11/08/2022 1303   HDL 52 11/08/2022 1303   CHOLHDL 2.1 11/08/2022 1303   LDLCALC 42 11/08/2022 1303     ASSESSMENT AND PLAN:    ICD-10-CM   1. Paroxysmal atrial fibrillation (HCC)  I48.0     2. HYPERTENSION, BENIGN  I10     3. Mixed hyperlipidemia  E78.2     4. Atrial fibrillation, currently in sinus rhythm  Z86.79 amiodarone (PACERONE) 200 MG tablet       Problem List Items Addressed This Visit       Cardiovascular and Mediastinum   HYPERTENSION, BENIGN    Better controlled since re starting clonidine.       Relevant Medications   amiodarone (PACERONE) 200 MG tablet   Paroxysmal atrial fibrillation (HCC) - Primary   Relevant Medications   amiodarone (PACERONE) 200 MG tablet     Other   Hyperlipidemia   Relevant Medications   amiodarone (PACERONE) 200 MG tablet   Atrial fibrillation, currently in sinus rhythm    Patient having dizziness, headache since starting amiodarone. Decrease amiodarone to 200 mg daily.       Relevant Medications   amiodarone (PACERONE) 200 MG tablet     Disposition:   Return in about 2 weeks (around 01/20/2023).    Total time spent: 30  minutes  Signed,  Adrian Blackwater, MD  01/06/2023 2:26 PM    [facility]

## 2023-01-06 NOTE — Assessment & Plan Note (Signed)
Patient having dizziness, headache since starting amiodarone. Decrease amiodarone to 200 mg daily.

## 2023-01-06 NOTE — Patient Instructions (Addendum)
Do not take amiodarone tonight.  Do not take amiodarone tomorrow.  On Wednesday, start taking amiodarone 200 mg once daily.

## 2023-01-07 ENCOUNTER — Encounter: Payer: Self-pay | Admitting: Cardiovascular Disease

## 2023-01-14 ENCOUNTER — Ambulatory Visit: Payer: Medicare PPO | Admitting: Cardiovascular Disease

## 2023-01-15 ENCOUNTER — Other Ambulatory Visit: Payer: Self-pay | Admitting: Nurse Practitioner

## 2023-01-15 DIAGNOSIS — Z8679 Personal history of other diseases of the circulatory system: Secondary | ICD-10-CM

## 2023-01-15 MED ORDER — AMIODARONE HCL 200 MG PO TABS: 200.0000 mg | ORAL_TABLET | Freq: Every day | ORAL | 2 refills | Status: DC

## 2023-01-21 ENCOUNTER — Ambulatory Visit: Payer: Medicare PPO | Admitting: Cardiovascular Disease

## 2023-01-21 ENCOUNTER — Encounter: Payer: Self-pay | Admitting: Cardiovascular Disease

## 2023-01-21 VITALS — BP 132/68 | HR 30 | Ht 70.0 in | Wt 255.0 lb

## 2023-01-21 DIAGNOSIS — R001 Bradycardia, unspecified: Secondary | ICD-10-CM | POA: Diagnosis not present

## 2023-01-21 DIAGNOSIS — Z8679 Personal history of other diseases of the circulatory system: Secondary | ICD-10-CM | POA: Diagnosis not present

## 2023-01-21 DIAGNOSIS — E782 Mixed hyperlipidemia: Secondary | ICD-10-CM | POA: Diagnosis not present

## 2023-01-21 DIAGNOSIS — I480 Paroxysmal atrial fibrillation: Secondary | ICD-10-CM

## 2023-01-21 DIAGNOSIS — I10 Essential (primary) hypertension: Secondary | ICD-10-CM

## 2023-01-21 MED ORDER — APIXABAN 5 MG PO TABS: 5.0000 mg | ORAL_TABLET | Freq: Two times a day (BID) | ORAL | 1 refills | Status: DC

## 2023-01-21 NOTE — Assessment & Plan Note (Signed)
In sinus rhythm on EKG.

## 2023-01-21 NOTE — Assessment & Plan Note (Signed)
Well controlled today.

## 2023-01-21 NOTE — Progress Notes (Signed)
Cardiology Office Note   Date:  01/21/2023   ID:  [name], [name] Apr 04, 1946, MRN [id number]  PCP:  Orson Eva, NP  Cardiologist:  Adrian Blackwater, MD      History of Present Illness: [name] is a 77 y.o. male who presents for  Chief Complaint  Patient presents with   Follow-up    2 week follow up    Patient in office for 2 week follow up. Complains of dizziness, headaches. Fell last week from weakness.     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 90 tablet 2   aspirin 81 MG EC tablet Take 81 mg by mouth daily.       atorvastatin (LIPITOR) 10 MG tablet Take 10 mg by mouth daily.     cloNIDine (CATAPRES) 0.2 MG tablet Take 1 tablet (0.2 mg total) by mouth 2 (two) times daily. 60 tablet 11   furosemide (LASIX) 40 MG tablet TAKE 1 TABLET EVERY MORNING FOR FLUID 90 tablet 3   hydrALAZINE (APRESOLINE) 100 MG tablet TAKE 1 TABLET THREE TIMES DAILY 270 tablet 3   losartan-hydrochlorothiazide (HYZAAR) 100-25 MG tablet TAKE 1 TABLET EVERY DAY 90 tablet 3   metFORMIN (GLUCOPHAGE) 500 MG tablet TAKE 1 TABLET TWICE DAILY 180 tablet 3   metoprolol succinate (TOPROL-XL) 100 MG 24 hr tablet TAKE 1 TABLET EVERY MORNING FOR BLOOD PRESSURE / HEART RATE LOWERING AGENT 90 tablet 3   metoprolol succinate (TOPROL-XL) 50 MG 24 hr tablet Take 50 mg by mouth daily.     Multiple Vitamins-Minerals (MENS 50+ MULTIVITAMIN PO) Take 1 tablet by mouth 1 day or 1 dose.     potassium chloride (KLOR-CON M) 10 MEQ tablet TAKE 1 TABLET EVERY MORNING WITH FUROSEMIDE 90 tablet 3   Vitamin D, Ergocalciferol, (DRISDOL) 1.25 MG (50000 UNIT) CAPS capsule Take 1  capsule by mouth every 7 (seven) days.      apixaban (ELIQUIS) 5 MG TABS tablet Take 1 tablet (5 mg total) by mouth 2 (two) times daily. 180 tablet 1   No current facility-administered medications for this visit.    Allergies:   Norvasc [amlodipine]    Social History:   reports that he has never smoked. He has never used smokeless tobacco. He reports current alcohol use. No history on file for drug use.   Family History:  family history includes Asthma in his sister; Cancer (age of onset: 56) in his father; Hypertension in his mother.    ROS:     Review of Systems  Constitutional: Negative.   HENT: Negative.    Eyes: Negative.   Respiratory: Negative.    Cardiovascular: Negative.   Gastrointestinal: Negative.   Genitourinary: Negative.   Musculoskeletal: Negative.   Skin: Negative.   Neurological:  Positive for dizziness.  Endo/Heme/Allergies: Negative.   Psychiatric/Behavioral: Negative.    All other systems reviewed and are negative.    All other systems are reviewed and negative.    PHYSICAL EXAM: VS:  BP 132/68   Pulse (!) 30   Ht 5\' 10"  (1.778 m)   Wt 255 lb (115.7  kg)   SpO2 100%   BMI 36.59 kg/m  , BMI Body mass index is 36.59 kg/m. Last weight:  Wt Readings from Last 3 Encounters:  01/21/23 255 lb (115.7 kg)  01/06/23 253 lb 9.6 oz (115 kg)  12/16/22 253 lb 6.4 oz (114.9 kg)    Physical Exam Vitals reviewed.  Constitutional:      Appearance: Normal appearance. He is normal weight.  HENT:     Head: Normocephalic.     Nose: Nose normal.     Mouth/Throat:     Mouth: Mucous membranes are moist.  Eyes:     Pupils: Pupils are equal, round, and reactive to light.  Cardiovascular:     Rate and Rhythm: Regular rhythm. Bradycardia present.     Pulses: Normal pulses.     Heart sounds: Normal heart sounds.  Pulmonary:     Effort: Pulmonary effort is normal.  Abdominal:     General: Abdomen is flat. Bowel sounds are normal.  Musculoskeletal:         General: Normal range of motion.     Cervical back: Normal range of motion.  Skin:    General: Skin is warm.  Neurological:     General: No focal deficit present.     Mental Status: He is alert.  Psychiatric:        Mood and Affect: Mood normal.      EKG: sinus bradycardia, HR 26 bpm, RBBB  Recent Labs: 11/08/2022: ALT 17; BUN 22; Creatinine, Ser 1.37; Potassium 4.1; Sodium 137; TSH 1.540 12/11/2022: Hemoglobin 12.7; Platelets 189    Lipid Panel    Component Value Date/Time   CHOL 107 11/08/2022 1303   TRIG 57 11/08/2022 1303   HDL 52 11/08/2022 1303   CHOLHDL 2.1 11/08/2022 1303   LDLCALC 42 11/08/2022 1303     ASSESSMENT AND PLAN:    ICD-10-CM   1. Sinus bradycardia  R00.1     2. HYPERTENSION, BENIGN  I10     3. Paroxysmal atrial fibrillation (HCC)  I48.0     4. Mixed hyperlipidemia  E78.2     5. Atrial fibrillation, currently in sinus rhythm  Z86.79 apixaban (ELIQUIS) 5 MG TABS tablet       Problem List Items Addressed This Visit       Cardiovascular and Mediastinum   HYPERTENSION, BENIGN    Well controlled today.      Relevant Medications   apixaban (ELIQUIS) 5 MG TABS tablet   Paroxysmal atrial fibrillation (HCC)    In sinus rhythm on EKG.      Relevant Medications   apixaban (ELIQUIS) 5 MG TABS tablet   Sinus bradycardia - Primary    Patient in sinus bradycardia today, HR 26 bpm on EKG. Will hold amiodarone, metoprolol and clonidine until appointment in 2 days.       Relevant Medications   apixaban (ELIQUIS) 5 MG TABS tablet     Other   Hyperlipidemia    LDL 11/08/22 42. Continue atorvastatin 10 mg daily.       Relevant Medications   apixaban (ELIQUIS) 5 MG TABS tablet   Atrial fibrillation, currently in sinus rhythm   Relevant Medications   apixaban (ELIQUIS) 5 MG TABS tablet     Disposition:   Return in about 2 days (around 01/23/2023).    Total time spent: 30 minutes  Signed,  Adrian Blackwater, MD  01/21/2023 2:19 PM    [facility]

## 2023-01-21 NOTE — Patient Instructions (Signed)
Do not take metoprolol, amiodarone or clonidine until next visit next Monday.

## 2023-01-21 NOTE — Assessment & Plan Note (Signed)
LDL 11/08/22 42. Continue atorvastatin 10 mg daily. 

## 2023-01-21 NOTE — Assessment & Plan Note (Signed)
Patient in sinus bradycardia today, HR 26 bpm on EKG. Will hold amiodarone, metoprolol and clonidine until appointment in 2 days.

## 2023-01-23 ENCOUNTER — Ambulatory Visit (INDEPENDENT_AMBULATORY_CARE_PROVIDER_SITE_OTHER): Payer: Medicare PPO | Admitting: Cardiovascular Disease

## 2023-01-23 ENCOUNTER — Encounter: Payer: Self-pay | Admitting: Cardiovascular Disease

## 2023-01-23 ENCOUNTER — Ambulatory Visit: Payer: Medicare PPO

## 2023-01-23 VITALS — BP 136/72 | HR 30 | Ht 70.0 in | Wt 255.2 lb

## 2023-01-23 DIAGNOSIS — R001 Bradycardia, unspecified: Secondary | ICD-10-CM | POA: Diagnosis not present

## 2023-01-23 DIAGNOSIS — I10 Essential (primary) hypertension: Secondary | ICD-10-CM

## 2023-01-23 DIAGNOSIS — I441 Atrioventricular block, second degree: Secondary | ICD-10-CM | POA: Diagnosis not present

## 2023-01-23 NOTE — Progress Notes (Signed)
Cardiology Office Note   Date:  01/23/2023   ID:  Arrick, Piskor 01/14/1946, MRN [id number]  PCP:  Orson Eva, NP  Cardiologist:  Adrian Blackwater, MD      History of Present Illness: [name] is a 77 y.o. male who presents for  Chief Complaint  Patient presents with   Follow-up    2 day follow up    Patient in office for 2 day follow up. Headaches gone. Feels fatigued.     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 180 tablet 1   aspirin 81 MG EC tablet Take 81 mg by mouth daily.       atorvastatin (LIPITOR) 10 MG tablet Take 10 mg by mouth daily.     furosemide (LASIX) 40 MG tablet TAKE 1 TABLET EVERY MORNING FOR FLUID 90 tablet 3   hydrALAZINE (APRESOLINE) 100 MG tablet TAKE 1 TABLET THREE TIMES DAILY 270 tablet 3   losartan-hydrochlorothiazide (HYZAAR) 100-25 MG tablet TAKE 1 TABLET EVERY DAY 90 tablet 3   metFORMIN (GLUCOPHAGE) 500 MG tablet TAKE 1 TABLET TWICE DAILY 180 tablet 3   Multiple Vitamins-Minerals (MENS 50+ MULTIVITAMIN PO) Take 1 tablet by mouth 1 day or 1 dose.     potassium chloride (KLOR-CON M) 10 MEQ tablet TAKE 1 TABLET EVERY MORNING WITH FUROSEMIDE 90 tablet 3   Vitamin D, Ergocalciferol, (DRISDOL) 1.25 MG (50000 UNIT) CAPS capsule Take 1 capsule by mouth every 7 (seven) days.      amiodarone (PACERONE) 200 MG tablet Take 1 tablet (200 mg total) by mouth daily. (Patient not taking: Reported on 01/23/2023) 90 tablet 2   cloNIDine (CATAPRES) 0.2 MG tablet Take 1 tablet (0.2 mg total) by mouth 2 (two) times daily. (Patient not taking: Reported on 01/23/2023) 60 tablet 11   metoprolol succinate  (TOPROL-XL) 100 MG 24 hr tablet TAKE 1 TABLET EVERY MORNING FOR BLOOD PRESSURE / HEART RATE LOWERING AGENT (Patient not taking: Reported on 01/23/2023) 90 tablet 3   metoprolol succinate (TOPROL-XL) 50 MG 24 hr tablet Take 50 mg by mouth daily. (Patient not taking: Reported on 01/23/2023)     No current facility-administered medications for this visit.    Allergies:   Norvasc [amlodipine]    Social History:   reports that he has never smoked. He has never used smokeless tobacco. He reports current alcohol use. No history on file for drug use.   Family History:  family history includes Asthma in his sister; Cancer (age of onset: 16) in his father; Hypertension in his mother.    ROS:     Review of Systems  Constitutional:  Positive for malaise/fatigue.  HENT: Negative.    Eyes: Negative.   Respiratory: Negative.    Cardiovascular: Negative.   Gastrointestinal: Negative.   Genitourinary: Negative.   Musculoskeletal: Negative.   Skin: Negative.   Neurological: Negative.   Endo/Heme/Allergies: Negative.   Psychiatric/Behavioral: Negative.    All other systems reviewed and are negative.    All other systems are reviewed and negative.    PHYSICAL EXAM: VS:  BP  136/72 (BP Location: Left Arm, Patient Position: Sitting, Cuff Size: Small)   Pulse (!) 30   Ht 5\' 10"  (1.778 m)   Wt 255 lb 3.2 oz (115.8 kg)   SpO2 99%   BMI 36.62 kg/m  , BMI Body mass index is 36.62 kg/m. Last weight:  Wt Readings from Last 3 Encounters:  01/23/23 255 lb 3.2 oz (115.8 kg)  01/21/23 255 lb (115.7 kg)  01/06/23 253 lb 9.6 oz (115 kg)     Physical Exam Vitals reviewed.  Constitutional:      Appearance: Normal appearance. He is normal weight.  HENT:     Head: Normocephalic.     Nose: Nose normal.     Mouth/Throat:     Mouth: Mucous membranes are moist.  Eyes:     Pupils: Pupils are equal, round, and reactive to light.  Cardiovascular:     Rate and Rhythm: Normal rate and regular rhythm.      Pulses: Normal pulses.     Heart sounds: Normal heart sounds.  Pulmonary:     Effort: Pulmonary effort is normal.  Abdominal:     General: Abdomen is flat. Bowel sounds are normal.  Musculoskeletal:        General: Normal range of motion.     Cervical back: Normal range of motion.  Skin:    General: Skin is warm.  Neurological:     General: No focal deficit present.     Mental Status: He is alert.  Psychiatric:        Mood and Affect: Mood normal.      EKG: Sinus bradycardia HR 27 bpm, incomplete RBBB, type II second degree AV block.   Recent Labs: 11/08/2022: ALT 17; BUN 22; Creatinine, Ser 1.37; Potassium 4.1; Sodium 137; TSH 1.540 12/11/2022: Hemoglobin 12.7; Platelets 189    Lipid Panel    Component Value Date/Time   CHOL 107 11/08/2022 1303   TRIG 57 11/08/2022 1303   HDL 52 11/08/2022 1303   CHOLHDL 2.1 11/08/2022 1303   LDLCALC 42 11/08/2022 1303     ASSESSMENT AND PLAN:    ICD-10-CM   1. Sinus bradycardia  R00.1     2. HYPERTENSION, BENIGN  I10     3. Second degree AV block, Mobitz type II  I44.1        Problem List Items Addressed This Visit       Cardiovascular and Mediastinum   HYPERTENSION, BENIGN    Controlled on recheck.      Sinus bradycardia - Primary    Sinus bradycardia HR 27 bpm, incomplete RBBB, type II second degree AV block. Patient denies syncopal episodes. May need a PPM. Return tomorrow to check heart rate.       Second degree AV block, Mobitz type II     Disposition:   Return in about 1 day (around 01/24/2023) for with ekg.    Total time spent: 30 minutes  Signed,  Adrian Blackwater, MD  01/23/2023 1:50 PM    [facility]

## 2023-01-23 NOTE — Assessment & Plan Note (Signed)
Sinus bradycardia HR 27 bpm, incomplete RBBB, type II second degree AV block. Patient denies syncopal episodes. May need a PPM. Return tomorrow to check heart rate.

## 2023-01-23 NOTE — Assessment & Plan Note (Signed)
Controlled on recheck.  

## 2023-01-24 ENCOUNTER — Ambulatory Visit: Payer: Medicare PPO

## 2023-01-24 ENCOUNTER — Ambulatory Visit (INDEPENDENT_AMBULATORY_CARE_PROVIDER_SITE_OTHER): Payer: Medicare PPO | Admitting: Cardiovascular Disease

## 2023-01-24 ENCOUNTER — Encounter: Payer: Self-pay | Admitting: Cardiovascular Disease

## 2023-01-24 ENCOUNTER — Ambulatory Visit: Payer: Medicare PPO | Admitting: Cardiovascular Disease

## 2023-01-24 VITALS — BP 130/80 | Ht 70.0 in | Wt 255.0 lb

## 2023-01-24 DIAGNOSIS — I10 Essential (primary) hypertension: Secondary | ICD-10-CM

## 2023-01-24 DIAGNOSIS — R001 Bradycardia, unspecified: Secondary | ICD-10-CM | POA: Diagnosis not present

## 2023-01-24 DIAGNOSIS — I441 Atrioventricular block, second degree: Secondary | ICD-10-CM

## 2023-01-24 DIAGNOSIS — I340 Nonrheumatic mitral (valve) insufficiency: Secondary | ICD-10-CM

## 2023-01-24 DIAGNOSIS — I480 Paroxysmal atrial fibrillation: Secondary | ICD-10-CM | POA: Diagnosis not present

## 2023-01-24 NOTE — Assessment & Plan Note (Signed)
Patient has been off metoprolol 100 mg for 4-5 days and still has heart rate 30, and type 2 second degree, advise PPM ASAP  by Dr. Daisy Blossom

## 2023-01-24 NOTE — Progress Notes (Signed)
Cardiology Office Note   Date:  01/24/2023   ID:  [name], [name] 20-Nov-1945, MRN [id number]  PCP:  Orson Eva, NP  Cardiologist:  Adrian Blackwater, MD      History of Present Illness: [name] is a 77 y.o. male who presents for No chief complaint on file.   Has DOE, and occasional lightheaded.Heart rate still 30.      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 180 tablet 1   aspirin 81 MG EC tablet Take 81 mg by mouth daily.       atorvastatin (LIPITOR) 10 MG tablet Take 10 mg by mouth daily.     cloNIDine (CATAPRES) 0.2 MG tablet Take 1 tablet (0.2 mg total) by mouth 2 (two) times daily. (Patient not taking: Reported on 01/23/2023) 60 tablet 11   furosemide (LASIX) 40 MG tablet TAKE 1 TABLET EVERY MORNING FOR FLUID 90 tablet 3   hydrALAZINE (APRESOLINE) 100 MG tablet TAKE 1 TABLET THREE TIMES DAILY 270 tablet 3   losartan-hydrochlorothiazide (HYZAAR) 100-25 MG tablet TAKE 1 TABLET EVERY DAY 90 tablet 3   metFORMIN (GLUCOPHAGE) 500 MG tablet TAKE 1 TABLET TWICE DAILY 180 tablet 3   Multiple Vitamins-Minerals (MENS 50+ MULTIVITAMIN PO) Take 1 tablet by mouth 1 day or 1 dose.     potassium chloride (KLOR-CON M) 10 MEQ tablet TAKE 1 TABLET EVERY MORNING WITH FUROSEMIDE 90 tablet 3   Vitamin D, Ergocalciferol, (DRISDOL) 1.25 MG (50000 UNIT) CAPS capsule Take 1 capsule by mouth every 7 (seven) days.      No current facility-administered medications for this visit.    Allergies:   Norvasc [amlodipine]    Social History:   reports that he has never smoked. He has never used smokeless tobacco. He reports current  alcohol use. No history on file for drug use.   Family History:  family history includes Asthma in his sister; Cancer (age of onset: 19) in his father; Hypertension in his mother.    ROS:     Review of Systems  Constitutional: Negative.   HENT: Negative.    Eyes: Negative.   Respiratory: Negative.    Gastrointestinal: Negative.   Genitourinary: Negative.   Musculoskeletal: Negative.   Skin: Negative.   Neurological: Negative.   Endo/Heme/Allergies: Negative.   Psychiatric/Behavioral: Negative.    All other systems reviewed and are negative.     All other systems are reviewed and negative.    PHYSICAL EXAM: VS:  BP 130/80   Ht 5\' 10"  (1.778 m)   Wt 255 lb (115.7 kg)   BMI 36.59 kg/m  , BMI Body mass index is 36.59 kg/m. Last weight:  Wt Readings from Last 3 Encounters:  01/24/23 255 lb (115.7 kg)  01/23/23 255 lb 3.2 oz (115.8 kg)  01/21/23 255 lb (115.7 kg)     Physical Exam Vitals reviewed.  Constitutional:      Appearance: Normal appearance. He is normal weight.  HENT:     Head: Normocephalic.     Nose: Nose normal.     Mouth/Throat:  Mouth: Mucous membranes are moist.  Eyes:     Pupils: Pupils are equal, round, and reactive to light.  Cardiovascular:     Rate and Rhythm: Normal rate and regular rhythm.     Pulses: Normal pulses.     Heart sounds: Normal heart sounds.  Pulmonary:     Effort: Pulmonary effort is normal.  Abdominal:     General: Abdomen is flat. Bowel sounds are normal.  Musculoskeletal:        General: Normal range of motion.     Cervical back: Normal range of motion.  Skin:    General: Skin is warm.  Neurological:     General: No focal deficit present.     Mental Status: He is alert.  Psychiatric:        Mood and Affect: Mood normal.       EKG: sinus bradycardia 30/min type 2 second degree  AV block  Recent Labs: 11/08/2022: ALT 17; BUN 22; Creatinine, Ser 1.37; Potassium 4.1; Sodium 137; TSH 1.540 12/11/2022: Hemoglobin  12.7; Platelets 189    Lipid Panel    Component Value Date/Time   CHOL 107 11/08/2022 1303   TRIG 57 11/08/2022 1303   HDL 52 11/08/2022 1303   CHOLHDL 2.1 11/08/2022 1303   LDLCALC 42 11/08/2022 1303      Other studies Reviewed: Additional studies/ records that were reviewed today include:  Review of the above records demonstrates:       No data to display            ASSESSMENT AND PLAN:    ICD-10-CM   1. HYPERTENSION, BENIGN  I10     2. Nonrheumatic mitral valve regurgitation  I34.0     3. Paroxysmal atrial fibrillation (HCC)  I48.0     4. Second degree AV block, Mobitz type II  I44.1     5. Sinus bradycardia  R00.1    heart rate 30       Problem List Items Addressed This Visit       Cardiovascular and Mediastinum   HYPERTENSION, BENIGN - Primary   Nonrheumatic mitral valve regurgitation   Paroxysmal atrial fibrillation (HCC)   Sinus bradycardia   Second degree AV block, Mobitz type II    Patient has been off metoprolol 100 mg for 4-5 days and still has heart rate 30, and type 2 second degree, advise PPM ASAP  by Dr. Daisy Blossom          Disposition:   Return in about 1 week (around 01/31/2023) for Set up to see Dr. Madelin Headings cardiology ASAP for Pacemaker as has typr 2 second degree av block..    Total time spent: 30 minutes  Signed,  Adrian Blackwater, MD  01/24/2023 11:43 AM    [facility]

## 2023-01-27 ENCOUNTER — Encounter: Payer: Self-pay | Admitting: Cardiovascular Disease

## 2023-02-06 ENCOUNTER — Ambulatory Visit: Payer: Medicare PPO | Admitting: Cardiovascular Disease

## 2023-02-12 ENCOUNTER — Ambulatory Visit: Payer: Medicare PPO | Admitting: Nurse Practitioner

## 2023-02-13 ENCOUNTER — Ambulatory Visit: Payer: Medicare PPO | Admitting: Nurse Practitioner

## 2023-02-17 DEATH — deceased
